# Patient Record
Sex: Female | Born: 1990 | Race: White | Hispanic: No | Marital: Married | Smoking: Never smoker
Health system: Southern US, Community
[De-identification: ages and names within clinical notes are randomized; demographics above are authoritative.]

## PROBLEM LIST (undated history)

## (undated) DIAGNOSIS — F32A Depression, unspecified: Secondary | ICD-10-CM

## (undated) DIAGNOSIS — R569 Unspecified convulsions: Secondary | ICD-10-CM

## (undated) DIAGNOSIS — F329 Major depressive disorder, single episode, unspecified: Secondary | ICD-10-CM

## (undated) DIAGNOSIS — F419 Anxiety disorder, unspecified: Secondary | ICD-10-CM

## (undated) HISTORY — DX: Anxiety disorder, unspecified: F41.9

## (undated) HISTORY — DX: Unspecified convulsions: R56.9

## (undated) HISTORY — DX: Depression, unspecified: F32.A

## (undated) HISTORY — PX: NO PAST SURGERIES: SHX2092

---
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
## 2016-10-23 ENCOUNTER — Encounter: Payer: Self-pay | Admitting: Obstetrics and Gynecology

## 2018-04-29 NOTE — L&D Delivery Note (Signed)
Patient was C/C/0 and pushed for approx 20 minutes with epidural.    NSVD female infant, Apgars 8/9, weight 8#15.   The patient had 1st degree laceration repaired with 3-0 vicryl. Fundus was firm. EBL was expected amount. Placenta was delivered intact. Vagina was clear.  Delayed cord clamping done for 30-60 seconds while warming baby. Baby was vigorous and doing skin to skin with mother.  Karen Valencia

## 2018-07-21 LAB — OB RESULTS CONSOLE ABO/RH: RH Type: POSITIVE

## 2018-07-21 LAB — OB RESULTS CONSOLE GC/CHLAMYDIA
Chlamydia: NEGATIVE
Gonorrhea: NEGATIVE

## 2018-07-21 LAB — OB RESULTS CONSOLE ANTIBODY SCREEN: Antibody Screen: NEGATIVE

## 2018-07-21 LAB — OB RESULTS CONSOLE RPR: RPR: NONREACTIVE

## 2018-07-21 LAB — OB RESULTS CONSOLE HEPATITIS B SURFACE ANTIGEN: Hepatitis B Surface Ag: NEGATIVE

## 2018-07-21 LAB — OB RESULTS CONSOLE HIV ANTIBODY (ROUTINE TESTING): HIV: NONREACTIVE

## 2018-07-21 LAB — OB RESULTS CONSOLE RUBELLA ANTIBODY, IGM: Rubella: IMMUNE

## 2019-01-15 LAB — OB RESULTS CONSOLE GBS: GBS: NEGATIVE

## 2019-02-05 ENCOUNTER — Other Ambulatory Visit: Payer: Self-pay | Admitting: Obstetrics & Gynecology

## 2019-02-05 ENCOUNTER — Telehealth (HOSPITAL_COMMUNITY): Payer: Self-pay | Admitting: *Deleted

## 2019-02-05 ENCOUNTER — Encounter (HOSPITAL_COMMUNITY): Payer: Self-pay | Admitting: *Deleted

## 2019-02-05 NOTE — Telephone Encounter (Signed)
Preadmission screen  

## 2019-02-08 ENCOUNTER — Telehealth (HOSPITAL_COMMUNITY): Payer: Self-pay | Admitting: *Deleted

## 2019-02-08 NOTE — Telephone Encounter (Signed)
Preadmission screen  

## 2019-02-09 ENCOUNTER — Encounter (HOSPITAL_COMMUNITY): Payer: Self-pay | Admitting: *Deleted

## 2019-02-09 ENCOUNTER — Telehealth (HOSPITAL_COMMUNITY): Payer: Self-pay | Admitting: *Deleted

## 2019-02-09 NOTE — Telephone Encounter (Signed)
Preadmission screen  

## 2019-02-11 ENCOUNTER — Other Ambulatory Visit: Payer: Self-pay

## 2019-02-11 ENCOUNTER — Encounter (HOSPITAL_COMMUNITY): Payer: Self-pay | Admitting: *Deleted

## 2019-02-11 ENCOUNTER — Inpatient Hospital Stay (HOSPITAL_COMMUNITY)
Admission: AD | Admit: 2019-02-11 | Discharge: 2019-02-12 | DRG: 807 | Disposition: A | Discharge status: Home / Self Care | Payer: BC Managed Care – PPO | Attending: Obstetrics and Gynecology | Admitting: Obstetrics and Gynecology

## 2019-02-11 ENCOUNTER — Inpatient Hospital Stay (HOSPITAL_COMMUNITY): Payer: BC Managed Care – PPO | Admitting: Anesthesiology

## 2019-02-11 DIAGNOSIS — Z20828 Contact with and (suspected) exposure to other viral communicable diseases: Secondary | ICD-10-CM | POA: Diagnosis present

## 2019-02-11 DIAGNOSIS — O26893 Other specified pregnancy related conditions, third trimester: Secondary | ICD-10-CM | POA: Diagnosis present

## 2019-02-11 DIAGNOSIS — Z3A40 40 weeks gestation of pregnancy: Secondary | ICD-10-CM

## 2019-02-11 DIAGNOSIS — E669 Obesity, unspecified: Secondary | ICD-10-CM

## 2019-02-11 DIAGNOSIS — O99214 Obesity complicating childbirth: Principal | ICD-10-CM | POA: Diagnosis present

## 2019-02-11 LAB — CBC
HCT: 40.5 % (ref 36.0–46.0)
Hemoglobin: 13.6 g/dL (ref 12.0–15.0)
MCH: 30.4 pg (ref 26.0–34.0)
MCHC: 33.6 g/dL (ref 30.0–36.0)
MCV: 90.6 fL (ref 80.0–100.0)
Platelets: 148 10*3/uL — ABNORMAL LOW (ref 150–400)
RBC: 4.47 MIL/uL (ref 3.87–5.11)
RDW: 14.3 % (ref 11.5–15.5)
WBC: 14.7 10*3/uL — ABNORMAL HIGH (ref 4.0–10.5)
nRBC: 0 % (ref 0.0–0.2)

## 2019-02-11 LAB — ABO/RH: ABO/RH(D): O POS

## 2019-02-11 LAB — TYPE AND SCREEN
ABO/RH(D): O POS
Antibody Screen: NEGATIVE

## 2019-02-11 LAB — SARS CORONAVIRUS 2 (TAT 6-24 HRS): SARS Coronavirus 2: NEGATIVE

## 2019-02-11 MED ORDER — ONDANSETRON HCL 4 MG/2ML IJ SOLN: 4.0000 mg | Freq: Four times a day (QID) | INTRAMUSCULAR | Status: DC | PRN

## 2019-02-11 MED ORDER — SOD CITRATE-CITRIC ACID 500-334 MG/5ML PO SOLN
30.0000 mL | ORAL | Status: DC | PRN
  Administered 2019-02-11: 30 mL via ORAL
  Filled 2019-02-11: qty 30

## 2019-02-11 MED ORDER — TETANUS-DIPHTH-ACELL PERTUSSIS 5-2.5-18.5 LF-MCG/0.5 IM SUSP: 0.5000 mL | Freq: Once | INTRAMUSCULAR | Status: DC

## 2019-02-11 MED ORDER — BENZOCAINE-MENTHOL 20-0.5 % EX AERO
1.0000 "application " | INHALATION_SPRAY | CUTANEOUS | Status: DC | PRN
  Filled 2019-02-11: qty 56

## 2019-02-11 MED ORDER — FLEET ENEMA 7-19 GM/118ML RE ENEM: 1.0000 | ENEMA | Freq: Once | RECTAL | Status: DC

## 2019-02-11 MED ORDER — PHENYLEPHRINE 40 MCG/ML (10ML) SYRINGE FOR IV PUSH (FOR BLOOD PRESSURE SUPPORT): 80.0000 ug | PREFILLED_SYRINGE | INTRAVENOUS | Status: DC | PRN

## 2019-02-11 MED ORDER — LACTATED RINGERS IV SOLN: 500.0000 mL | INTRAVENOUS | Status: DC | PRN

## 2019-02-11 MED ORDER — PHENYLEPHRINE 40 MCG/ML (10ML) SYRINGE FOR IV PUSH (FOR BLOOD PRESSURE SUPPORT)
80.0000 ug | PREFILLED_SYRINGE | INTRAVENOUS | Status: DC | PRN
  Filled 2019-02-11: qty 10

## 2019-02-11 MED ORDER — OXYTOCIN BOLUS FROM INFUSION
500.0000 mL | Freq: Once | INTRAVENOUS | Status: DC
  Administered 2019-02-11: 500 mL via INTRAVENOUS

## 2019-02-11 MED ORDER — OXYTOCIN 40 UNITS IN NORMAL SALINE INFUSION - SIMPLE MED
2.5000 [IU]/h | INTRAVENOUS | Status: DC
  Filled 2019-02-11: qty 1000

## 2019-02-11 MED ORDER — ONDANSETRON HCL 4 MG/2ML IJ SOLN: 4.0000 mg | INTRAMUSCULAR | Status: DC | PRN

## 2019-02-11 MED ORDER — DIBUCAINE (PERIANAL) 1 % EX OINT: 1.0000 "application " | TOPICAL_OINTMENT | CUTANEOUS | Status: DC | PRN

## 2019-02-11 MED ORDER — LIDOCAINE HCL (PF) 1 % IJ SOLN: 30.0000 mL | INTRAMUSCULAR | Status: DC | PRN

## 2019-02-11 MED ORDER — ZOLPIDEM TARTRATE 5 MG PO TABS: 5.0000 mg | ORAL_TABLET | Freq: Every evening | ORAL | Status: DC | PRN

## 2019-02-11 MED ORDER — EPHEDRINE 5 MG/ML INJ: 10.0000 mg | INTRAVENOUS | Status: DC | PRN

## 2019-02-11 MED ORDER — SENNOSIDES-DOCUSATE SODIUM 8.6-50 MG PO TABS
2.0000 | ORAL_TABLET | ORAL | Status: DC
  Administered 2019-02-12: 2 via ORAL
  Filled 2019-02-11: qty 2

## 2019-02-11 MED ORDER — SIMETHICONE 80 MG PO CHEW: 80.0000 mg | CHEWABLE_TABLET | ORAL | Status: DC | PRN

## 2019-02-11 MED ORDER — LIDOCAINE HCL (PF) 1 % IJ SOLN
INTRAMUSCULAR | Status: DC | PRN
  Administered 2019-02-11: 10 mL via EPIDURAL

## 2019-02-11 MED ORDER — OXYCODONE-ACETAMINOPHEN 5-325 MG PO TABS: 1.0000 | ORAL_TABLET | ORAL | Status: DC | PRN

## 2019-02-11 MED ORDER — LACTATED RINGERS IV SOLN
INTRAVENOUS | Status: DC
  Administered 2019-02-11: 14:00:00 via INTRAVENOUS

## 2019-02-11 MED ORDER — OXYCODONE-ACETAMINOPHEN 5-325 MG PO TABS: 2.0000 | ORAL_TABLET | ORAL | Status: DC | PRN

## 2019-02-11 MED ORDER — FENTANYL-BUPIVACAINE-NACL 0.5-0.125-0.9 MG/250ML-% EP SOLN
12.0000 mL/h | EPIDURAL | Status: DC | PRN
  Filled 2019-02-11: qty 250

## 2019-02-11 MED ORDER — WITCH HAZEL-GLYCERIN EX PADS: 1.0000 "application " | MEDICATED_PAD | CUTANEOUS | Status: DC | PRN

## 2019-02-11 MED ORDER — IBUPROFEN 600 MG PO TABS
600.0000 mg | ORAL_TABLET | Freq: Four times a day (QID) | ORAL | Status: DC
  Administered 2019-02-12 (×4): 600 mg via ORAL
  Filled 2019-02-11 (×4): qty 1

## 2019-02-11 MED ORDER — SODIUM CHLORIDE (PF) 0.9 % IJ SOLN
INTRAMUSCULAR | Status: DC | PRN
  Administered 2019-02-11: 12 mL/h via EPIDURAL

## 2019-02-11 MED ORDER — OXYCODONE-ACETAMINOPHEN 5-325 MG PO TABS
2.0000 | ORAL_TABLET | ORAL | Status: DC | PRN
  Administered 2019-02-12 (×2): 2 via ORAL
  Filled 2019-02-11 (×2): qty 2

## 2019-02-11 MED ORDER — LACTATED RINGERS IV SOLN: 500.0000 mL | Freq: Once | INTRAVENOUS | Status: DC

## 2019-02-11 MED ORDER — COCONUT OIL OIL: 1.0000 "application " | TOPICAL_OIL | Status: DC | PRN

## 2019-02-11 MED ORDER — ACETAMINOPHEN 325 MG PO TABS: 650.0000 mg | ORAL_TABLET | ORAL | Status: DC | PRN

## 2019-02-11 MED ORDER — PRENATAL MULTIVITAMIN CH
1.0000 | ORAL_TABLET | Freq: Every day | ORAL | Status: DC
  Administered 2019-02-12: 1 via ORAL
  Filled 2019-02-11: qty 1

## 2019-02-11 MED ORDER — DIPHENHYDRAMINE HCL 25 MG PO CAPS: 25.0000 mg | ORAL_CAPSULE | Freq: Four times a day (QID) | ORAL | Status: DC | PRN

## 2019-02-11 MED ORDER — DIPHENHYDRAMINE HCL 50 MG/ML IJ SOLN: 12.5000 mg | INTRAMUSCULAR | Status: DC | PRN

## 2019-02-11 MED ORDER — ONDANSETRON HCL 4 MG PO TABS: 4.0000 mg | ORAL_TABLET | ORAL | Status: DC | PRN

## 2019-02-11 MED ORDER — ACETAMINOPHEN 325 MG PO TABS
650.0000 mg | ORAL_TABLET | ORAL | Status: DC | PRN
  Administered 2019-02-11: 650 mg via ORAL
  Filled 2019-02-11: qty 2

## 2019-02-11 NOTE — Anesthesia Preprocedure Evaluation (Signed)
Anesthesia Evaluation  Patient identified by MRN, date of birth, ID band Patient awake    Reviewed: Allergy & Precautions, H&P , NPO status , Patient's Chart, lab work & pertinent test results  History of Anesthesia Complications Negative for: history of anesthetic complications  Airway Mallampati: II  TM Distance: >3 FB Neck ROM: full    Dental no notable dental hx.    Pulmonary neg pulmonary ROS,    Pulmonary exam normal        Cardiovascular negative cardio ROS Normal cardiovascular exam Rhythm:regular Rate:Normal     Neuro/Psych PSYCHIATRIC DISORDERS Anxiety Depression negative neurological ROS     GI/Hepatic negative GI ROS, Neg liver ROS,   Endo/Other  Morbid obesity  Renal/GU negative Renal ROS  negative genitourinary   Musculoskeletal   Abdominal   Peds  Hematology negative hematology ROS (+)   Anesthesia Other Findings   Reproductive/Obstetrics (+) Pregnancy                             Anesthesia Physical Anesthesia Plan  ASA: III  Anesthesia Plan: Epidural   Post-op Pain Management:    Induction:   PONV Risk Score and Plan:   Airway Management Planned:   Additional Equipment:   Intra-op Plan:   Post-operative Plan:   Informed Consent: I have reviewed the patients History and Physical, chart, labs and discussed the procedure including the risks, benefits and alternatives for the proposed anesthesia with the patient or authorized representative who has indicated his/her understanding and acceptance.       Plan Discussed with:   Anesthesia Plan Comments:         Anesthesia Quick Evaluation

## 2019-02-11 NOTE — MAU Note (Signed)
Pt reports ctxs since 250am. Now closer and stronger. Reports mucus plug came out but no bleeding or leaking of fluid at this time. Good fetal movement felt.

## 2019-02-11 NOTE — Anesthesia Procedure Notes (Signed)
Epidural Patient location during procedure: OB Start time: 02/11/2019 2:30 PM End time: 02/11/2019 2:42 PM  Staffing Anesthesiologist: Lidia Collum, MD Performed: anesthesiologist   Preanesthetic Checklist Completed: patient identified, pre-op evaluation, timeout performed, IV checked, risks and benefits discussed and monitors and equipment checked  Epidural Patient position: sitting Prep: DuraPrep Patient monitoring: heart rate, continuous pulse ox and blood pressure Approach: midline Location: L3-L4 Injection technique: LOR air  Needle:  Needle type: Tuohy  Needle gauge: 17 G Needle length: 9 cm Needle insertion depth: 6 cm Catheter type: closed end flexible Catheter size: 19 Gauge Catheter at skin depth: 11 cm Test dose: negative  Assessment Events: blood not aspirated, injection not painful, no injection resistance, negative IV test and no paresthesia  Additional Notes Reason for block:procedure for pain

## 2019-02-12 DIAGNOSIS — E669 Obesity, unspecified: Secondary | ICD-10-CM

## 2019-02-12 LAB — CBC
HCT: 34.6 % — ABNORMAL LOW (ref 36.0–46.0)
Hemoglobin: 11.7 g/dL — ABNORMAL LOW (ref 12.0–15.0)
MCH: 30.4 pg (ref 26.0–34.0)
MCHC: 33.8 g/dL (ref 30.0–36.0)
MCV: 89.9 fL (ref 80.0–100.0)
Platelets: 142 10*3/uL — ABNORMAL LOW (ref 150–400)
RBC: 3.85 MIL/uL — ABNORMAL LOW (ref 3.87–5.11)
RDW: 14.5 % (ref 11.5–15.5)
WBC: 17.5 10*3/uL — ABNORMAL HIGH (ref 4.0–10.5)
nRBC: 0 % (ref 0.0–0.2)

## 2019-02-12 LAB — RPR: RPR Ser Ql: NONREACTIVE

## 2019-02-12 MED ORDER — OXYCODONE-ACETAMINOPHEN 5-325 MG PO TABS: 1.0000 | ORAL_TABLET | ORAL | 0 refills | Status: DC | PRN

## 2019-02-12 MED ORDER — IBUPROFEN 600 MG PO TABS: 600.0000 mg | ORAL_TABLET | Freq: Four times a day (QID) | ORAL | 1 refills | Status: DC

## 2019-02-12 MED ORDER — SENNOSIDES-DOCUSATE SODIUM 8.6-50 MG PO TABS: 2.0000 | ORAL_TABLET | ORAL | 0 refills | Status: DC

## 2019-02-12 NOTE — Anesthesia Postprocedure Evaluation (Signed)
Anesthesia Post Note  Patient: Karen Valencia  Procedure(s) Performed: AN AD HOC LABOR EPIDURAL     Patient location during evaluation: Mother Baby Anesthesia Type: Epidural Level of consciousness: awake and alert, oriented and patient cooperative Pain management: pain level controlled Vital Signs Assessment: post-procedure vital signs reviewed and stable Respiratory status: spontaneous breathing Cardiovascular status: stable Postop Assessment: no headache, epidural receding, patient able to bend at knees and no signs of nausea or vomiting Anesthetic complications: no Comments: Pt. Interviewed via phone consultation.  Pt. States she is walking.  Pain score 3.      Last Vitals:  Vitals:   02/12/19 0000 02/12/19 0517  BP: 115/84 107/62  Pulse: (!) 101 96  Resp:  17  Temp: 36.8 C 36.9 C  SpO2: 99% 98%    Last Pain:  Vitals:   02/12/19 0521  TempSrc:   PainSc: 2    Pain Goal:                   [facility]

## 2019-02-12 NOTE — Progress Notes (Signed)
Post Partum Day 1 Subjective: no complaints, up ad lib, voiding and tolerating PO  Objective: Vitals:   02/11/19 1814 02/11/19 1925 02/12/19 0000 02/12/19 0517  BP: 125/73 (!) 123/94 115/84 107/62  Pulse: (!) 108 (!) 117 (!) 101 96  Resp: 18 19  17   Temp: 99.3 F (37.4 C) 99.1 F (37.3 C) 98.3 F (36.8 C) 98.4 F (36.9 C)  TempSrc: Oral Oral Oral Oral  SpO2: 99% 98% 99% 98%  Weight:      Height:        Physical Exam:  General: alert and no distress Lochia: appropriate Uterine Fundus: firm DVT Evaluation: No evidence of DVT seen on physical exam.  Recent Labs    02/11/19 1335 02/12/19 0538  HGB 13.6 11.7*  HCT 40.5 34.6*    Assessment/Plan: Karen Valencia 28 y.o. G3P2011 PPD#1, sp SVD 10/15.  Doing well, appropriate for discharge this afternoon Planning circ with peds outpatient Considering BTL vs. Vasectomy Formula feeing Rubella immune, O+  LOS: 1 day   Brooks Kinnan K Taam-Akelman 02/12/2019, 9:31 AM

## 2019-02-12 NOTE — Progress Notes (Signed)
CSW received consult for history of anxiety and depression.  CSW met with MOB to offer support and complete assessment.    MOB resting in bed holding infant with FOB present at bedside, when CSW entered the room. CSW introduced self and received verbal permission from MOB to complete assessment with FOB present. CSW introduced self and explained reason for consult to which MOB expressed understanding. MOB and FOB both pleasant and engaged throughout assessment and were observed to be well-bonded to infant. CSW inquired about MOB's mental health history and MOB acknowledged having anxiety and depression back in 2015 but attributed much of it to her PCOS and Nexplanon. MOB reported feeling overwhelmed every once in a while but stated symptoms were manageable. MOB did acknowledge feeling some depression following the birth of her first child but feels it had to do with her weight. CSW inquired about how MOB was feeling now and MOB reported she feels "good and complete".  CSW provided education regarding the baby blues period vs. perinatal mood disorders, discussed treatment and gave resources for mental health follow up if concerns arise. CSW recommended self-evaluation during the postpartum time period using the New Mom Checklist from Postpartum Progress and encouraged MOB to contact a medical professional if symptoms are noted at any time. MOB did not appear to be displaying any acute mental health symptoms and denied any current SI or HI. MOB reported feeling well supported by FOB, her mother-in-law and her family in [place].   MOB confirmed having all essential items for infant once discharged and reported infant would be sleeping in a crib once home. CSW provided review of Sudden Infant Death Syndrome (SIDS) precautions and safe sleeping habits.    CSW identifies no further need for intervention and no barriers to discharge at this time.  Elijio Miles, [place]  [facility] [employer] [phone]

## 2019-02-12 NOTE — Discharge Summary (Signed)
OB Discharge Summary     Patient Name: Karen Valencia DOB: 04-25-1991 MRN: [id number]  Date of admission: 02/11/2019 Delivering MD: Allyn Kenner   Date of discharge: 02/12/2019  Admitting diagnosis: CTX 2 to 58mins Intrauterine pregnancy: [redacted]w[redacted]d     Secondary diagnosis:  Active Problems:   Normal labor   Obesity  Additional problems: none     Discharge diagnosis: Term Pregnancy Delivered                                                                                                Post partum procedures:none  Augmentation: AROM  Complications: None  Hospital course:  Onset of Labor With Vaginal Delivery     28 y.o. yo G3P2011 at [redacted]w[redacted]d was admitted in Latent Labor on 02/11/2019. Patient had an uncomplicated labor course as follows: on admit 3.5/70/-2, contracting spontaneously, 4 hours later progressed to 6/90, AROM'ed and then 1 hour later was C/C/0 and pushed for approx 20 minutes with epidural.    Membrane Rupture Time/Date: 3:41 PM ,02/11/2019   Intrapartum Procedures: Episiotomy: None [1]                                         Lacerations:  1st degree [2]  Patient had a delivery of a Viable infant. 02/11/2019  Information for the patient's newborn:  Lanetta, Figuero [[id number]]  Delivery Method: Vag-Spont     Pateint had an uncomplicated postpartum course.  She is ambulating, tolerating a regular diet, passing flatus, and urinating well. Patient is discharged home in stable condition on 02/12/19.   Physical exam  Vitals:   02/11/19 1814 02/11/19 1925 02/12/19 0000 02/12/19 0517  BP: 125/73 (!) 123/94 115/84 107/62  Pulse: (!) 108 (!) 117 (!) 101 96  Resp: 18 19  17   Temp: 99.3 F (37.4 C) 99.1 F (37.3 C) 98.3 F (36.8 C) 98.4 F (36.9 C)  TempSrc: Oral Oral Oral Oral  SpO2: 99% 98% 99% 98%  Weight:      Height:       General: alert Lochia: appropriate Uterine Fundus: firm DVT Evaluation: No evidence of DVT seen on physical exam. Labs: Lab  Results  Component Value Date   WBC 17.5 (H) 02/12/2019   HGB 11.7 (L) 02/12/2019   HCT 34.6 (L) 02/12/2019   MCV 89.9 02/12/2019   PLT 142 (L) 02/12/2019   No flowsheet data found.  Discharge instruction: per After Visit Summary and "Baby and Me Booklet".  After visit meds:  Allergies as of 02/12/2019   No Known Allergies     Medication List    TAKE these medications   acetaminophen 325 MG tablet Commonly known as: TYLENOL Take 325-650 mg by mouth every 6 (six) hours as needed for mild pain.   calcium carbonate 500 MG chewable tablet Commonly known as: TUMS - dosed in mg elemental calcium Chew 1-2 tablets by mouth as needed for indigestion or heartburn.   clindamycin 1 % gel Commonly known as: CLINDAGEL Apply 1  application topically See admin instructions. APPLY THIN COAT TO AFFECTED AREA TWICE A DAY   ibuprofen 600 MG tablet Commonly known as: ADVIL Take 1 tablet (600 mg total) by mouth every 6 (six) hours.   oxyCODONE-acetaminophen 5-325 MG tablet Commonly known as: PERCOCET/ROXICET Take 1 tablet by mouth every 4 (four) hours as needed (pain scale 4-7).   senna-docusate 8.6-50 MG tablet Commonly known as: Senokot-S Take 2 tablets by mouth daily. Start taking on: February 13, 2019       Diet: routine diet  Activity: Advance as tolerated. Pelvic rest for 6 weeks.   Outpatient follow up:4 weeks Follow up Appt: none Follow up Visit:No follow-ups on file.  Postpartum contraception: BTL vs. basectomy  Newborn Data: Live born female  Birth Weight: 8 lb 15 oz (4054 g) APGAR: 8, 9  Newborn Delivery   Birth date/time: 02/11/2019 16:26:00 Delivery type: Vaginal, Spontaneous      Baby Feeding: Bottle Disposition:home with mother   02/12/2019 Rande Brunt, MD

## 2019-02-13 ENCOUNTER — Other Ambulatory Visit (HOSPITAL_COMMUNITY): Admission: RE | Admit: 2019-02-13 | Payer: BC Managed Care – PPO | Source: Ambulatory Visit

## 2019-02-15 ENCOUNTER — Inpatient Hospital Stay (HOSPITAL_COMMUNITY): Payer: BC Managed Care – PPO

## 2019-02-15 ENCOUNTER — Inpatient Hospital Stay (HOSPITAL_COMMUNITY)
Admission: AD | Admit: 2019-02-15 | Payer: BC Managed Care – PPO | Source: Home / Self Care | Admitting: Obstetrics & Gynecology

## 2019-02-15 NOTE — H&P (Signed)
28 y.o. [redacted]w[redacted]d  G3P2011 comes in c/o contractions.  Otherwise has good fetal movement and no bleeding.  Past Medical History:  Diagnosis Date  . Anxiety   . Depression   . Seizures (HCC)    childhood    Past Surgical History:  Procedure Laterality Date  . NO PAST SURGERIES      OB History  Gravida Para Term Preterm AB Living  3 2 2   1 1   SAB TAB Ectopic Multiple Live Births  1     0 1    # Outcome Date GA Lbr Len/2nd Weight Sex Delivery Anes PTL Lv  3 Term 02/11/19 [redacted]w[redacted]d 06:04 / 00:22 4054 g M Vag-Spont EPI  LIV  2 Term 2017     Vag-Spont     1 SAB             Social History   Socioeconomic History  . Marital status: Married    Spouse name: Not on file  . Number of children: Not on file  . Years of education: Not on file  . Highest education level: Not on file  Occupational History  . Not on file  Social Needs  . Financial resource strain: Not on file  . Food insecurity    Worry: Not on file    Inability: Not on file  . Transportation needs    Medical: Not on file    Non-medical: Not on file  Tobacco Use  . Smoking status: Never Smoker  . Smokeless tobacco: Never Used  Substance and Sexual Activity  . Alcohol use: Not Currently  . Drug use: Never  . Sexual activity: Yes    Birth control/protection: None  Lifestyle  . Physical activity    Days per week: Not on file    Minutes per session: Not on file  . Stress: Not on file  Relationships  . Social 2018 on phone: Not on file    Gets together: Not on file    Attends religious service: Not on file    Active member of club or organization: Not on file    Attends meetings of clubs or organizations: Not on file    Relationship status: Not on file  . Intimate partner violence    Fear of current or ex partner: Not on file    Emotionally abused: Not on file    Physically abused: Not on file    Forced sexual activity: Not on file  Other Topics Concern  . Not on file  Social History Narrative   . Not on file   Patient has no known allergies.    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 Toco:  q 2-5   A/P   Admitted for labor  GBS Neg  EFW 6.5# #35 weeks Current EFW 8.5# estimated by [facility] Epidural when desired Other routine care Allyn Kenner

## 2020-05-09 ENCOUNTER — Other Ambulatory Visit: Payer: Self-pay

## 2020-05-09 ENCOUNTER — Encounter: Payer: Self-pay | Admitting: Emergency Medicine

## 2020-05-09 ENCOUNTER — Ambulatory Visit
Admission: EM | Admit: 2020-05-09 | Discharge: 2020-05-09 | Disposition: A | Discharge status: Home / Self Care | Payer: Self-pay | Attending: Family Medicine | Admitting: Family Medicine

## 2020-05-09 DIAGNOSIS — L02416 Cutaneous abscess of left lower limb: Secondary | ICD-10-CM

## 2020-05-09 MED ORDER — DOXYCYCLINE HYCLATE 100 MG PO TABS: 100.0000 mg | ORAL_TABLET | Freq: Two times a day (BID) | ORAL | 0 refills | Status: AC

## 2020-05-09 NOTE — Discharge Instructions (Addendum)
Change the dressing every 24 hours for the next two days.  Clean the area with soap and water.  We would like you to come back in two days for re-evaluation. We have prescribed doxycycline for you to take twice a day.  If you have any signs of systemic infection such as fever or vomiting please come back to get re-evaluated.

## 2020-05-09 NOTE — ED Provider Notes (Signed)
[facility]    CSN: [id number] Arrival date & time: 05/09/20  1355      History   Chief Complaint Chief Complaint  Patient presents with  . Abscess    HPI Karen Valencia is a 30 y.o. female.   3 days of pain on inner thigh.  Pt has a history of abscesses.  She usually gets them on her abdomen and axilla.  She is not experiencing any drainage currently.  Usually they go away on their own but this one is especially painful and would like it drained. No fevers or vomiting.      Past Medical History:  Diagnosis Date  . Anxiety   . Depression   . Seizures (HCC)    childhood    Patient Active Problem List   Diagnosis Date Noted  . Obesity 02/12/2019  . Normal labor 02/11/2019    Past Surgical History:  Procedure Laterality Date  . NO PAST SURGERIES      OB History    Gravida  3   Para  2   Term  2   Preterm      AB  1   Living  1     SAB  1   IAB      Ectopic      Multiple  0   Live Births  1            Home Medications    Prior to Admission medications   Medication Sig Start Date End Date Taking? Authorizing Provider  doxycycline (VIBRA-TABS) 100 MG tablet Take 1 tablet (100 mg total) by mouth 2 (two) times daily for 5 days. 05/09/20 05/14/20 Yes Sandre Kitty, MD  acetaminophen (TYLENOL) 325 MG tablet Take 325-650 mg by mouth every 6 (six) hours as needed for mild pain.    [provider]  calcium carbonate (TUMS - DOSED IN MG ELEMENTAL CALCIUM) 500 MG chewable tablet Chew 1-2 tablets by mouth as needed for indigestion or heartburn.    [provider]  clindamycin (CLINDAGEL) 1 % gel Apply 1 application topically See admin instructions. APPLY THIN COAT TO AFFECTED AREA TWICE A DAY Patient not taking: Reported on 05/09/2020 11/17/18   [provider]  ibuprofen (ADVIL) 600 MG tablet Take 1 tablet (600 mg total) by mouth every 6 (six) hours. 02/12/19   Taam-Akelman, Griselda Miner, MD  oxyCODONE-acetaminophen  (PERCOCET/ROXICET) 5-325 MG tablet Take 1 tablet by mouth every 4 (four) hours as needed (pain scale 4-7). Patient not taking: Reported on 05/09/2020 02/12/19   Rande Brunt, MD  senna-docusate (SENOKOT-S) 8.6-50 MG tablet Take 2 tablets by mouth daily. 02/13/19   Rande Brunt, MD    Family History History reviewed. No pertinent family history.  Social History Social History   Tobacco Use  . Smoking status: Never Smoker  . Smokeless tobacco: Never Used  Vaping Use  . Vaping Use: Never used  Substance Use Topics  . Alcohol use: Not Currently  . Drug use: Never     Allergies   Patient has no known allergies.   Review of Systems Review of Systems  All other systems reviewed and are negative.    Physical Exam Triage Vital Signs ED Triage Vitals [05/09/20 1458]  Enc Vitals Group     BP (!) 144/90     Pulse Rate (!) 110     Resp 18     Temp 98.1 F (36.7 C)     Temp Source Oral  SpO2 98 %     Weight      Height      Head Circumference      Peak Flow      Pain Score 8     Pain Loc      Pain Edu?      Excl. in GC?    No data found.  Updated Vital Signs BP (!) 144/90 (BP Location: Right Arm)   Pulse (!) 110   Temp 98.1 F (36.7 C) (Oral)   Resp 18   SpO2 98%   Visual Acuity Right Eye Distance:   Left Eye Distance:   Bilateral Distance:    Right Eye Near:   Left Eye Near:    Bilateral Near:     Physical Exam Constitutional:      Appearance: She is obese.  Skin:    Findings: Abscess present.       Neurological:     Mental Status: She is alert.      UC Treatments / Results  Labs (all labs ordered are listed, but only abnormal results are displayed) Labs Reviewed - No data to display  EKG   Radiology No results found.  Procedures Incision and Drainage  Date/Time: 05/09/2020 3:51 PM Performed by: Sandre Kitty, MD Authorized by: Merrilee Jansky, MD   Consent:    Consent obtained:  Verbal   Consent  given by:  Patient   Risks, benefits, and alternatives were discussed: yes     Risks discussed:  Bleeding, incomplete drainage and infection   Alternatives discussed:  No treatment Universal protocol:    Procedure explained and questions answered to patient or proxy's satisfaction: yes     Patient identity confirmed:  Verbally with patient Location:    Type:  Abscess   Size:  3cm   Location:  Lower extremity   Lower extremity location:  Leg   Leg location:  L upper leg Pre-procedure details:    Skin preparation:  Antiseptic wash and povidone-iodine Sedation:    Sedation type:  None Anesthesia:    Anesthesia method:  Local infiltration   Local anesthetic:  Lidocaine 2% w/o epi Procedure type:    Complexity:  Simple Procedure details:    Ultrasound guidance: no     Needle aspiration: no     Incision types:  Cruciate   Incision depth:  Dermal   Wound management:  Probed and deloculated   Drainage:  Bloody and purulent   Drainage amount:  Moderate   Wound treatment:  Wound left open   Packing materials:  None Post-procedure details:    Procedure completion:  Tolerated well, no immediate complications   (including critical care time)  Medications Ordered in UC Medications - No data to display  Initial Impression / Assessment and Plan / UC Course  I have reviewed the triage vital signs and the nursing notes.  Pertinent labs & imaging results that were available during my care of the patient were reviewed by me and considered in my medical decision making (see chart for details).     Patient with skin abscess on medial left thigh for past three days. Incision and drainage performed.  Wound dressing was applied.  Doxycycline x  5days was given. Patient advised to return in two days for re-evaluation.    Final Clinical Impressions(s) / UC Diagnoses   Final diagnoses:  Cutaneous abscess of left lower extremity     Discharge Instructions     Change the dressing every 24  hours for the next two days.  Clean the area with soap and water.  We would like you to come back in two days for re-evaluation. We have prescribed doxycycline for you to take twice a day.  If you have any signs of systemic infection such as fever or vomiting please come back to get re-evaluated.     ED Prescriptions    Medication Sig Dispense Auth. Provider   doxycycline (VIBRA-TABS) 100 MG tablet Take 1 tablet (100 mg total) by mouth 2 (two) times daily for 5 days. 10 tablet Sandre Kitty, MD     PDMP not reviewed this encounter.   Sandre Kitty, MD 05/09/20 [id number]

## 2020-05-09 NOTE — ED Triage Notes (Signed)
Pt here for left upper thigh abscess x 3 days with pain and swelling; pt sts hx of similar in past

## 2020-11-14 LAB — HEPATITIS C ANTIBODY: HCV Ab: NEGATIVE

## 2020-11-14 LAB — OB RESULTS CONSOLE HIV ANTIBODY (ROUTINE TESTING): HIV: NONREACTIVE

## 2020-11-14 LAB — OB RESULTS CONSOLE RPR: RPR: NONREACTIVE

## 2020-11-14 LAB — OB RESULTS CONSOLE HEPATITIS B SURFACE ANTIGEN: Hepatitis B Surface Ag: NEGATIVE

## 2020-11-14 LAB — OB RESULTS CONSOLE RUBELLA ANTIBODY, IGM: Rubella: IMMUNE

## 2020-11-14 LAB — OB RESULTS CONSOLE GC/CHLAMYDIA
Chlamydia: NEGATIVE
Gonorrhea: NEGATIVE

## 2020-11-14 LAB — OB RESULTS CONSOLE VARICELLA ZOSTER ANTIBODY, IGG: Varicella: IMMUNE

## 2021-01-15 ENCOUNTER — Other Ambulatory Visit: Payer: Self-pay | Admitting: Obstetrics and Gynecology

## 2021-01-15 DIAGNOSIS — F418 Other specified anxiety disorders: Secondary | ICD-10-CM

## 2021-01-15 DIAGNOSIS — Z3A21 21 weeks gestation of pregnancy: Secondary | ICD-10-CM

## 2021-01-15 DIAGNOSIS — O283 Abnormal ultrasonic finding on antenatal screening of mother: Secondary | ICD-10-CM

## 2021-01-15 DIAGNOSIS — Z363 Encounter for antenatal screening for malformations: Secondary | ICD-10-CM

## 2021-01-17 ENCOUNTER — Other Ambulatory Visit: Payer: Self-pay

## 2021-01-18 ENCOUNTER — Ambulatory Visit: Payer: Medicaid Other

## 2021-01-18 ENCOUNTER — Ambulatory Visit: Payer: Medicaid Other | Attending: Obstetrics and Gynecology

## 2021-01-18 ENCOUNTER — Encounter: Payer: Self-pay | Admitting: *Deleted

## 2021-01-18 ENCOUNTER — Ambulatory Visit: Payer: Medicaid Other | Admitting: *Deleted

## 2021-01-18 ENCOUNTER — Other Ambulatory Visit: Payer: Self-pay

## 2021-01-18 ENCOUNTER — Ambulatory Visit (HOSPITAL_BASED_OUTPATIENT_CLINIC_OR_DEPARTMENT_OTHER): Payer: Medicaid Other | Admitting: Maternal & Fetal Medicine

## 2021-01-18 VITALS — BP 138/77 | HR 105

## 2021-01-18 DIAGNOSIS — Z363 Encounter for antenatal screening for malformations: Secondary | ICD-10-CM | POA: Insufficient documentation

## 2021-01-18 DIAGNOSIS — O283 Abnormal ultrasonic finding on antenatal screening of mother: Secondary | ICD-10-CM | POA: Diagnosis present

## 2021-01-18 DIAGNOSIS — O99342 Other mental disorders complicating pregnancy, second trimester: Secondary | ICD-10-CM | POA: Insufficient documentation

## 2021-01-18 DIAGNOSIS — Z3A21 21 weeks gestation of pregnancy: Secondary | ICD-10-CM

## 2021-01-18 DIAGNOSIS — O43102 Malformation of placenta, unspecified, second trimester: Secondary | ICD-10-CM | POA: Diagnosis not present

## 2021-01-18 DIAGNOSIS — O99212 Obesity complicating pregnancy, second trimester: Secondary | ICD-10-CM | POA: Insufficient documentation

## 2021-01-18 DIAGNOSIS — F418 Other specified anxiety disorders: Secondary | ICD-10-CM | POA: Insufficient documentation

## 2021-01-18 DIAGNOSIS — E669 Obesity, unspecified: Secondary | ICD-10-CM

## 2021-01-18 DIAGNOSIS — O020 Blighted ovum and nonhydatidiform mole: Secondary | ICD-10-CM

## 2021-01-18 NOTE — Progress Notes (Signed)
MFM Consultation  Karen Valencia is a 30 yo G4P1 who is here at 45 w 2 d for a consultation at the request of Dr. Pryor Ochoa.   She is dated by a LMP consistent with a 12 w 2 d ultrasound.   She is seen at the request of Dr. Mindi Slicker due to an abnormal ultrasound finding of a anterior wall cystic area with concerns for complete hydatiform mole.   [name] pregnancy related issues include:  1) Early subchorionic hemorrhage: 11/14/20. She reports significant bleeding heavier than a menses. She reports her last bleed was 4 weeks ago. She reports some cramping but they have resolved mostly. We do not have a prior ultrasound to evaluate the images or findings seen at that time. Her CBC was normal. Her prenatal notes suggest that the [facility] was 5.6 cm. We don't have a serum quantitative hcg at the time of this visit.  2) Elevate BMI > 40; She is taking low dose ASA daily. She is planning for serial growth exams during the pregnancy and initiating weekly testing at 36 weeks. She is aware of the goal of 11-20 lb weight gain throughout the pregnancy.   3) History of seizures as a child: She denies seizures for > 10 years. She is not on medical therapy.  4) Chronic migraines: [name] has had bilateral temporal headaches. She denies nausea and vomiting. She has not taken anything for her headaches.   Vitals with BMI 01/18/2021 05/09/2020 02/12/2019  Height - - -  Weight - - -  BMI - - -  Systolic 138 144 643  Diastolic 77 90 62  Pulse 105 110 96   OB History  Gravida Para Term Preterm AB Living  4 2 2  0 1 1  SAB IAB Ectopic Multiple Live Births  1 0 0 0 1    # Outcome Date GA Lbr Len/2nd Weight Sex Delivery Anes PTL Lv  4 Current           3 Term 02/11/19 [redacted]w[redacted]d 06:04 / 00:22 8 lb 15 oz (4.054 kg) M Vag-Spont EPI  LIV     Name: Karen Valencia     Apgar1: 8  Apgar5: 9  2 Term 2017     Vag-Spont     1 SAB            Past Medical History:  Diagnosis Date   Anxiety    Depression    Seizures  (HCC)    childhood   Past Surgical History:  Procedure Laterality Date   NO PAST SURGERIES     Social History   Socioeconomic History   Marital status: Married    Spouse name: Not on file   Number of children: Not on file   Years of education: Not on file   Highest education level: Not on file  Occupational History   Not on file  Tobacco Use   Smoking status: Never   Smokeless tobacco: Never  Vaping Use   Vaping Use: Never used  Substance and Sexual Activity   Alcohol use: Not Currently   Drug use: Never   Sexual activity: Yes    Birth control/protection: None  Other Topics Concern   Not on file  Social History Narrative   Not on file   Social Determinants of Health   Financial Resource Strain: Not on file  Food Insecurity: Not on file  Transportation Needs: Not on file  Physical Activity: Not on file  Stress: Not on file  Social Connections: Not on file  Intimate Partner Violence: Not on file       Current Outpatient Medications (Analgesics):    acetaminophen (TYLENOL) 325 MG tablet, Take 325-650 mg by mouth every 6 (six) hours as needed for mild pain.   ibuprofen (ADVIL) 600 MG tablet, Take 1 tablet (600 mg total) by mouth every 6 (six) hours.   oxyCODONE-acetaminophen (PERCOCET/ROXICET) 5-325 MG tablet, Take 1 tablet by mouth every 4 (four) hours as needed (pain scale 4-7). (Patient not taking: Reported on 05/09/2020)   Current Outpatient Medications (Other):    calcium carbonate (TUMS - DOSED IN MG ELEMENTAL CALCIUM) 500 MG chewable tablet, Chew 1-2 tablets by mouth as needed for indigestion or heartburn.   clindamycin (CLINDAGEL) 1 % gel, Apply 1 application topically See admin instructions. APPLY THIN COAT TO AFFECTED AREA TWICE A DAY (Patient not taking: Reported on 05/09/2020)   senna-docusate (SENOKOT-S) 8.6-50 MG tablet, Take 2 tablets by mouth daily.  No Known Allergies  Imaging: A single intrauterine pregnancy was observed. A detailed exam was  performed. Suboptimal views of the fetal anatomy was seen  due to fetal position.  The placenta was posterior and normal in appearance.  We observed an anterior elliptical shaped area that had a heterogenous cystic appearance. There was no color flow through the area and appears subamniotic. It was suspicious for a prior hemorrhage that was resolveing. The placental uterine interface appeared normal for a single intrauterine pregnancy with no evidence of possible twin membrane. There was no twin peak sign.  Impression/Counseling:  1) Elliptical subamniotic cystic area: I discussed today's findings and reviewed the imaging. I explained that the differential diagnosis includes subamniotic absorbing hemorrhage, vs complete co-twin hydatidiform mole. We discussed an evaluation of labs including hcg, CBC and CMP. A UPC could not be drawn from our lab here today, so please draw this lab at your convenience. I explained that a molar pregnancy will likely have an elevated hcg beyond this gestational age. In addition, we discussed that there is a distinct placenta with a normally growth fetus, making it less likely that it is a partial mole. Lastly, I suspect that this is most likely a resolving hemorrhage from a month ago.  We discussed that there is an increased risk for PPROM, PTB and placental abruption in cases where there is a hemorrhage.   We ordered a q-hcg, TSH, CBC and CMP today. We have scheduled [name] to return in 2 weeks for a limited exam and in 4 weeks for a growth exam.  2) BMI > 40 - I agree with the counseling and plan for TWG and serial growth. I reiterated this today. [name] expressed and understanding.   3) Regarding Seizures: Expectant management  4) Regarding chronic migraines: Consider referral to neurologist for evaluation.   I spent 60 minutes with > 50% in face to face consultation  [name], MD.

## 2021-01-19 LAB — COMPREHENSIVE METABOLIC PANEL
ALT: 6 IU/L (ref 0–32)
AST: 12 IU/L (ref 0–40)
Albumin/Globulin Ratio: 1.6 (ref 1.2–2.2)
Albumin: 3.8 g/dL — ABNORMAL LOW (ref 3.9–5.0)
Alkaline Phosphatase: 35 IU/L — ABNORMAL LOW (ref 44–121)
BUN/Creatinine Ratio: 13 (ref 9–23)
BUN: 6 mg/dL (ref 6–20)
Bilirubin Total: 0.2 mg/dL (ref 0.0–1.2)
CO2: 17 mmol/L — ABNORMAL LOW (ref 20–29)
Calcium: 9 mg/dL (ref 8.7–10.2)
Chloride: 102 mmol/L (ref 96–106)
Creatinine, Ser: 0.48 mg/dL — ABNORMAL LOW (ref 0.57–1.00)
Globulin, Total: 2.4 g/dL (ref 1.5–4.5)
Glucose: 105 mg/dL — ABNORMAL HIGH (ref 65–99)
Potassium: 4 mmol/L (ref 3.5–5.2)
Sodium: 135 mmol/L (ref 134–144)
Total Protein: 6.2 g/dL (ref 6.0–8.5)
eGFR: 132 mL/min/{1.73_m2} (ref >59)

## 2021-01-19 LAB — CBC
Hematocrit: 36.9 % (ref 34.0–46.6)
Hemoglobin: 12.2 g/dL (ref 11.1–15.9)
MCH: 30.2 pg (ref 26.6–33.0)
MCHC: 33.1 g/dL (ref 31.5–35.7)
MCV: 91 fL (ref 79–97)
Platelets: 183 10*3/uL (ref 150–450)
RBC: 4.04 x10E6/uL (ref 3.77–5.28)
RDW: 12.8 % (ref 11.7–15.4)
WBC: 9.6 10*3/uL (ref 3.4–10.8)

## 2021-01-19 LAB — HUMAN CHORIONIC GONADOTROPIN(HCG),B-SUBUNIT,QUANTITATIVE): HCG, Beta Chain, Quant, S: 39867 m[IU]/mL

## 2021-01-19 LAB — TSH: TSH: 0.902 u[IU]/mL (ref 0.450–4.500)

## 2021-01-23 ENCOUNTER — Ambulatory Visit: Payer: Medicaid Other

## 2021-02-08 ENCOUNTER — Other Ambulatory Visit: Payer: Self-pay

## 2021-02-08 ENCOUNTER — Encounter: Payer: Self-pay | Admitting: *Deleted

## 2021-02-08 ENCOUNTER — Other Ambulatory Visit: Payer: Self-pay | Admitting: *Deleted

## 2021-02-08 ENCOUNTER — Ambulatory Visit: Payer: Medicaid Other | Attending: Maternal & Fetal Medicine

## 2021-02-08 ENCOUNTER — Ambulatory Visit: Payer: Medicaid Other | Admitting: *Deleted

## 2021-02-08 VITALS — BP 111/65 | HR 101

## 2021-02-08 DIAGNOSIS — Z3A24 24 weeks gestation of pregnancy: Secondary | ICD-10-CM | POA: Diagnosis not present

## 2021-02-08 DIAGNOSIS — Z6841 Body Mass Index (BMI) 40.0 and over, adult: Secondary | ICD-10-CM | POA: Diagnosis present

## 2021-02-08 DIAGNOSIS — E669 Obesity, unspecified: Secondary | ICD-10-CM

## 2021-02-08 DIAGNOSIS — O99212 Obesity complicating pregnancy, second trimester: Secondary | ICD-10-CM | POA: Insufficient documentation

## 2021-02-23 ENCOUNTER — Other Ambulatory Visit: Payer: Self-pay

## 2021-02-23 ENCOUNTER — Ambulatory Visit (HOSPITAL_BASED_OUTPATIENT_CLINIC_OR_DEPARTMENT_OTHER): Payer: Medicaid Other | Admitting: *Deleted

## 2021-02-23 ENCOUNTER — Other Ambulatory Visit: Payer: Self-pay | Admitting: *Deleted

## 2021-02-23 ENCOUNTER — Ambulatory Visit: Payer: Medicaid Other | Attending: Obstetrics

## 2021-02-23 ENCOUNTER — Encounter: Payer: Self-pay | Admitting: *Deleted

## 2021-02-23 ENCOUNTER — Ambulatory Visit: Payer: Medicaid Other | Admitting: *Deleted

## 2021-02-23 VITALS — BP 112/72 | HR 98

## 2021-02-23 DIAGNOSIS — Z3A26 26 weeks gestation of pregnancy: Secondary | ICD-10-CM | POA: Insufficient documentation

## 2021-02-23 DIAGNOSIS — O365920 Maternal care for other known or suspected poor fetal growth, second trimester, not applicable or unspecified: Secondary | ICD-10-CM

## 2021-02-23 DIAGNOSIS — Z6841 Body Mass Index (BMI) 40.0 and over, adult: Secondary | ICD-10-CM | POA: Diagnosis present

## 2021-02-23 DIAGNOSIS — O365921 Maternal care for other known or suspected poor fetal growth, second trimester, fetus 1: Secondary | ICD-10-CM | POA: Diagnosis present

## 2021-02-23 DIAGNOSIS — O4692 Antepartum hemorrhage, unspecified, second trimester: Secondary | ICD-10-CM

## 2021-02-23 DIAGNOSIS — O99213 Obesity complicating pregnancy, third trimester: Secondary | ICD-10-CM | POA: Diagnosis not present

## 2021-02-23 DIAGNOSIS — O99212 Obesity complicating pregnancy, second trimester: Secondary | ICD-10-CM | POA: Diagnosis not present

## 2021-02-23 DIAGNOSIS — O4593 Premature separation of placenta, unspecified, third trimester: Secondary | ICD-10-CM | POA: Insufficient documentation

## 2021-02-23 DIAGNOSIS — O365931 Maternal care for other known or suspected poor fetal growth, third trimester, fetus 1: Secondary | ICD-10-CM | POA: Diagnosis not present

## 2021-02-23 DIAGNOSIS — E669 Obesity, unspecified: Secondary | ICD-10-CM

## 2021-02-23 NOTE — Procedures (Signed)
Karen Valencia 03-12-1991 [redacted]w[redacted]d  Fetus A Non-Stress Test Interpretation for 02/23/21  Indication: IUGR  Fetal Heart Rate A Mode: External Baseline Rate (A): 145 bpm Variability: Minimal, Moderate Accelerations: 10 x 10 Decelerations: Variable Multiple birth?: No  Uterine Activity Mode: Palpation, Toco Contraction Frequency (min): none Resting Tone Palpated: Relaxed  Interpretation (Fetal Testing) Nonstress Test Interpretation: Reactive Overall Impression: Reassuring for gestational age Comments: Dr. Parke Poisson reviewed tracing

## 2021-02-28 ENCOUNTER — Ambulatory Visit (HOSPITAL_BASED_OUTPATIENT_CLINIC_OR_DEPARTMENT_OTHER): Payer: Medicaid Other | Admitting: *Deleted

## 2021-02-28 ENCOUNTER — Encounter: Payer: Self-pay | Admitting: *Deleted

## 2021-02-28 ENCOUNTER — Other Ambulatory Visit: Payer: Self-pay

## 2021-02-28 ENCOUNTER — Ambulatory Visit: Payer: Medicaid Other | Attending: Obstetrics

## 2021-02-28 ENCOUNTER — Ambulatory Visit: Payer: Medicaid Other | Admitting: *Deleted

## 2021-02-28 VITALS — BP 123/69 | HR 103

## 2021-02-28 DIAGNOSIS — O365990 Maternal care for other known or suspected poor fetal growth, unspecified trimester, not applicable or unspecified: Secondary | ICD-10-CM

## 2021-02-28 DIAGNOSIS — O365920 Maternal care for other known or suspected poor fetal growth, second trimester, not applicable or unspecified: Secondary | ICD-10-CM

## 2021-02-28 DIAGNOSIS — O99213 Obesity complicating pregnancy, third trimester: Secondary | ICD-10-CM | POA: Diagnosis not present

## 2021-02-28 DIAGNOSIS — O4593 Premature separation of placenta, unspecified, third trimester: Secondary | ICD-10-CM | POA: Insufficient documentation

## 2021-02-28 DIAGNOSIS — E669 Obesity, unspecified: Secondary | ICD-10-CM | POA: Diagnosis not present

## 2021-02-28 DIAGNOSIS — Z3A27 27 weeks gestation of pregnancy: Secondary | ICD-10-CM | POA: Insufficient documentation

## 2021-02-28 DIAGNOSIS — O99212 Obesity complicating pregnancy, second trimester: Secondary | ICD-10-CM | POA: Diagnosis not present

## 2021-02-28 DIAGNOSIS — Z6841 Body Mass Index (BMI) 40.0 and over, adult: Secondary | ICD-10-CM

## 2021-02-28 DIAGNOSIS — O365930 Maternal care for other known or suspected poor fetal growth, third trimester, not applicable or unspecified: Secondary | ICD-10-CM | POA: Diagnosis not present

## 2021-02-28 NOTE — Procedures (Signed)
Karen Valencia 08/05/1990 [redacted]w[redacted]d  Fetus A Non-Stress Test Interpretation for 02/28/21  Indication: IUGR  Fetal Heart Rate A Mode: External Baseline Rate (A): 140 bpm Variability: Moderate Accelerations: 10 x 10 Decelerations: Variable Multiple birth?: No  Uterine Activity Mode: Toco Contraction Frequency (min): none Resting Tone Palpated: Relaxed  Interpretation (Fetal Testing) Nonstress Test Interpretation: Reactive Overall Impression: Reassuring for gestational age Comments: dr. Grace Bushy reviewed tracing

## 2021-03-08 ENCOUNTER — Other Ambulatory Visit: Payer: Medicaid Other

## 2021-03-08 NOTE — Addendum Note (Signed)
Addended by: [name] on: 03/08/2021 01:06 PM   Modules accepted: Level of Service

## 2021-03-09 ENCOUNTER — Ambulatory Visit: Payer: Medicaid Other

## 2021-03-15 ENCOUNTER — Other Ambulatory Visit: Payer: Medicaid Other

## 2021-03-15 ENCOUNTER — Ambulatory Visit: Payer: Medicaid Other

## 2021-03-20 ENCOUNTER — Other Ambulatory Visit: Payer: Medicaid Other

## 2021-03-20 ENCOUNTER — Ambulatory Visit: Payer: Medicaid Other

## 2021-03-29 ENCOUNTER — Encounter: Payer: Self-pay | Admitting: *Deleted

## 2021-03-29 ENCOUNTER — Ambulatory Visit: Payer: Medicaid Other | Admitting: *Deleted

## 2021-03-29 ENCOUNTER — Other Ambulatory Visit: Payer: Self-pay | Admitting: *Deleted

## 2021-03-29 ENCOUNTER — Ambulatory Visit: Payer: Medicaid Other | Attending: Maternal & Fetal Medicine

## 2021-03-29 ENCOUNTER — Other Ambulatory Visit: Payer: Self-pay

## 2021-03-29 VITALS — BP 105/65 | HR 93

## 2021-03-29 DIAGNOSIS — E669 Obesity, unspecified: Secondary | ICD-10-CM

## 2021-03-29 DIAGNOSIS — Z3A31 31 weeks gestation of pregnancy: Secondary | ICD-10-CM | POA: Diagnosis not present

## 2021-03-29 DIAGNOSIS — O99213 Obesity complicating pregnancy, third trimester: Secondary | ICD-10-CM

## 2021-03-29 DIAGNOSIS — O365920 Maternal care for other known or suspected poor fetal growth, second trimester, not applicable or unspecified: Secondary | ICD-10-CM

## 2021-03-29 DIAGNOSIS — O99212 Obesity complicating pregnancy, second trimester: Secondary | ICD-10-CM

## 2021-03-29 DIAGNOSIS — O365921 Maternal care for other known or suspected poor fetal growth, second trimester, fetus 1: Secondary | ICD-10-CM | POA: Diagnosis not present

## 2021-04-19 ENCOUNTER — Ambulatory Visit: Payer: Medicaid Other | Admitting: *Deleted

## 2021-04-19 ENCOUNTER — Other Ambulatory Visit: Payer: Self-pay

## 2021-04-19 ENCOUNTER — Ambulatory Visit: Payer: Medicaid Other | Attending: Maternal & Fetal Medicine

## 2021-04-19 VITALS — BP 117/74 | HR 90

## 2021-04-19 DIAGNOSIS — E669 Obesity, unspecified: Secondary | ICD-10-CM | POA: Diagnosis not present

## 2021-04-19 DIAGNOSIS — Z3A33 33 weeks gestation of pregnancy: Secondary | ICD-10-CM | POA: Diagnosis not present

## 2021-04-19 DIAGNOSIS — O99213 Obesity complicating pregnancy, third trimester: Secondary | ICD-10-CM | POA: Insufficient documentation

## 2021-04-26 ENCOUNTER — Other Ambulatory Visit: Payer: Self-pay

## 2021-04-26 ENCOUNTER — Ambulatory Visit: Payer: Medicaid Other | Admitting: *Deleted

## 2021-04-26 ENCOUNTER — Ambulatory Visit: Payer: Medicaid Other | Attending: Maternal & Fetal Medicine

## 2021-04-26 VITALS — BP 114/71 | HR 97

## 2021-04-26 DIAGNOSIS — O99213 Obesity complicating pregnancy, third trimester: Secondary | ICD-10-CM | POA: Insufficient documentation

## 2021-04-26 DIAGNOSIS — O365931 Maternal care for other known or suspected poor fetal growth, third trimester, fetus 1: Secondary | ICD-10-CM

## 2021-04-26 DIAGNOSIS — O365930 Maternal care for other known or suspected poor fetal growth, third trimester, not applicable or unspecified: Secondary | ICD-10-CM | POA: Diagnosis not present

## 2021-04-26 DIAGNOSIS — Z3A35 35 weeks gestation of pregnancy: Secondary | ICD-10-CM | POA: Diagnosis not present

## 2021-04-26 DIAGNOSIS — E669 Obesity, unspecified: Secondary | ICD-10-CM

## 2021-04-26 NOTE — Procedures (Signed)
Karen Valencia 28-Apr-1991 [redacted]w[redacted]d  Fetus A Non-Stress Test Interpretation for 04/26/21  Indication: IUGR  Fetal Heart Rate A Mode: External Baseline Rate (A): 135 bpm Variability: Moderate Accelerations: 15 x 15 Decelerations: None  Uterine Activity Mode: Toco Contraction Frequency (min): UI Resting Tone Palpated: Relaxed  Interpretation (Fetal Testing) Nonstress Test Interpretation: Reactive Overall Impression: Reassuring for gestational age Comments: tracing reviewed by Dr. Judeth Cornfield

## 2021-04-27 ENCOUNTER — Other Ambulatory Visit: Payer: Self-pay | Admitting: *Deleted

## 2021-04-27 DIAGNOSIS — Z6841 Body Mass Index (BMI) 40.0 and over, adult: Secondary | ICD-10-CM

## 2021-04-29 NOTE — L&D Delivery Note (Signed)
Delivery Note Kwynn Schlotter is a [id number] at [redacted]w[redacted]d who had a spontaneous delivery at 0318 a viable female was delivered via ROA.  APGAR: 6, 7; weight 6lb8.1oz (2950g)  .     Admitted for IOL for BPP 4/8. Induced with pit and AROM. Progressed quickly after AROM. Received epidural for pain management.   Called at [phone] and notified of recurrent late decelerations not responding to position change. Prior to this point, was having early/variable decelerations. At bedside, I examined the patient and she was found to be fully dilated. Pushed for 2 minutes. Baby was delivered without difficulty. Tight nuchal cord reduced after delivery of infant body.  Delayed cord clamping for 60 seconds. Delivery of placenta was spontaneous. Placenta was found to be intact with 5cm blood clot adherent to surface, 3 -vessel cord was noted. The fundus was found to be firm. Perineum intact. Estimated blood loss 150cc. Instrument and gauze counts were correct at the end of the procedure.  After the cord was clamped and cut, infant taken to warmer for resuscitation due to poor oxygenation: PPV, suctioning.    Placenta status: to pathology  Anesthesia:  epidural Episiotomy:  none Lacerations:  none Suture Repair: N/A Est. Blood Loss (mL):  150  Mom to postpartum.  Baby to Couplet care / Skin to Skin.  Charlett Nose 05/16/2021, 3:49 AM

## 2021-05-01 ENCOUNTER — Other Ambulatory Visit: Payer: Self-pay | Admitting: Maternal & Fetal Medicine

## 2021-05-01 DIAGNOSIS — O99213 Obesity complicating pregnancy, third trimester: Secondary | ICD-10-CM

## 2021-05-03 ENCOUNTER — Ambulatory Visit: Payer: Medicaid Other | Attending: Maternal & Fetal Medicine

## 2021-05-03 ENCOUNTER — Ambulatory Visit: Payer: Medicaid Other

## 2021-05-09 ENCOUNTER — Ambulatory Visit: Payer: Medicaid Other

## 2021-05-09 ENCOUNTER — Ambulatory Visit: Payer: Medicaid Other | Attending: Obstetrics and Gynecology

## 2021-05-15 ENCOUNTER — Encounter (HOSPITAL_COMMUNITY): Payer: Self-pay | Admitting: Obstetrics and Gynecology

## 2021-05-15 ENCOUNTER — Inpatient Hospital Stay (HOSPITAL_COMMUNITY): Payer: Medicaid Other | Admitting: Anesthesiology

## 2021-05-15 ENCOUNTER — Other Ambulatory Visit: Payer: Self-pay

## 2021-05-15 ENCOUNTER — Inpatient Hospital Stay (HOSPITAL_COMMUNITY)
Admission: AD | Admit: 2021-05-15 | Discharge: 2021-05-17 | DRG: 805 | Disposition: A | Discharge status: Home / Self Care | Payer: Medicaid Other | Attending: Obstetrics and Gynecology | Admitting: Obstetrics and Gynecology

## 2021-05-15 DIAGNOSIS — O368130 Decreased fetal movements, third trimester, not applicable or unspecified: Secondary | ICD-10-CM | POA: Diagnosis present

## 2021-05-15 DIAGNOSIS — O4593 Premature separation of placenta, unspecified, third trimester: Secondary | ICD-10-CM | POA: Diagnosis present

## 2021-05-15 DIAGNOSIS — O368190 Decreased fetal movements, unspecified trimester, not applicable or unspecified: Principal | ICD-10-CM | POA: Diagnosis present

## 2021-05-15 DIAGNOSIS — Z20822 Contact with and (suspected) exposure to covid-19: Secondary | ICD-10-CM | POA: Diagnosis present

## 2021-05-15 DIAGNOSIS — F419 Anxiety disorder, unspecified: Secondary | ICD-10-CM | POA: Diagnosis present

## 2021-05-15 DIAGNOSIS — O99214 Obesity complicating childbirth: Secondary | ICD-10-CM | POA: Diagnosis present

## 2021-05-15 DIAGNOSIS — Z3A38 38 weeks gestation of pregnancy: Secondary | ICD-10-CM

## 2021-05-15 DIAGNOSIS — F32A Depression, unspecified: Secondary | ICD-10-CM | POA: Diagnosis present

## 2021-05-15 DIAGNOSIS — O365930 Maternal care for other known or suspected poor fetal growth, third trimester, not applicable or unspecified: Secondary | ICD-10-CM | POA: Diagnosis present

## 2021-05-15 DIAGNOSIS — O99344 Other mental disorders complicating childbirth: Secondary | ICD-10-CM | POA: Diagnosis present

## 2021-05-15 LAB — RESP PANEL BY RT-PCR (FLU A&B, COVID) ARPGX2
Influenza A by PCR: NEGATIVE
Influenza B by PCR: NEGATIVE
SARS Coronavirus 2 by RT PCR: NEGATIVE

## 2021-05-15 LAB — OB RESULTS CONSOLE HEPATITIS B SURFACE ANTIGEN: Hepatitis B Surface Ag: NEGATIVE

## 2021-05-15 LAB — CBC
HCT: 40.5 % (ref 36.0–46.0)
Hemoglobin: 13.1 g/dL (ref 12.0–15.0)
MCH: 29.4 pg (ref 26.0–34.0)
MCHC: 32.3 g/dL (ref 30.0–36.0)
MCV: 90.8 fL (ref 80.0–100.0)
Platelets: 174 10*3/uL (ref 150–400)
RBC: 4.46 MIL/uL (ref 3.87–5.11)
RDW: 14 % (ref 11.5–15.5)
WBC: 9.2 10*3/uL (ref 4.0–10.5)
nRBC: 0 % (ref 0.0–0.2)

## 2021-05-15 LAB — OB RESULTS CONSOLE GBS: GBS: NEGATIVE

## 2021-05-15 LAB — OB RESULTS CONSOLE ABO/RH: RH Type: POSITIVE

## 2021-05-15 LAB — OB RESULTS CONSOLE GC/CHLAMYDIA
Chlamydia: NEGATIVE
Gonorrhea: NEGATIVE

## 2021-05-15 LAB — OB RESULTS CONSOLE HIV ANTIBODY (ROUTINE TESTING): HIV: NONREACTIVE

## 2021-05-15 LAB — OB RESULTS CONSOLE RUBELLA ANTIBODY, IGM: Rubella: IMMUNE

## 2021-05-15 LAB — OB RESULTS CONSOLE ANTIBODY SCREEN: Antibody Screen: NEGATIVE

## 2021-05-15 LAB — TYPE AND SCREEN
ABO/RH(D): O POS
Antibody Screen: NEGATIVE

## 2021-05-15 LAB — OB RESULTS CONSOLE RPR: RPR: NONREACTIVE

## 2021-05-15 MED ORDER — TERBUTALINE SULFATE 1 MG/ML IJ SOLN: 0.2500 mg | Freq: Once | INTRAMUSCULAR | Status: DC | PRN

## 2021-05-15 MED ORDER — FAMOTIDINE 20 MG PO TABS: 20.0000 mg | ORAL_TABLET | Freq: Two times a day (BID) | ORAL | Status: DC

## 2021-05-15 MED ORDER — EPHEDRINE 5 MG/ML INJ: 10.0000 mg | INTRAVENOUS | Status: DC | PRN

## 2021-05-15 MED ORDER — FENTANYL CITRATE (PF) 100 MCG/2ML IJ SOLN: 50.0000 ug | INTRAMUSCULAR | Status: DC | PRN

## 2021-05-15 MED ORDER — FENTANYL-BUPIVACAINE-NACL 0.5-0.125-0.9 MG/250ML-% EP SOLN
12.0000 mL/h | EPIDURAL | Status: DC | PRN
  Filled 2021-05-15: qty 250

## 2021-05-15 MED ORDER — LIDOCAINE HCL (PF) 1 % IJ SOLN
INTRAMUSCULAR | Status: DC | PRN
  Administered 2021-05-15: 10 mL via EPIDURAL
  Administered 2021-05-15: 2 mL via EPIDURAL

## 2021-05-15 MED ORDER — ONDANSETRON HCL 4 MG/2ML IJ SOLN: 4.0000 mg | Freq: Four times a day (QID) | INTRAMUSCULAR | Status: DC | PRN

## 2021-05-15 MED ORDER — PHENYLEPHRINE 40 MCG/ML (10ML) SYRINGE FOR IV PUSH (FOR BLOOD PRESSURE SUPPORT): 80.0000 ug | PREFILLED_SYRINGE | INTRAVENOUS | Status: DC | PRN

## 2021-05-15 MED ORDER — FENTANYL-BUPIVACAINE-NACL 0.5-0.125-0.9 MG/250ML-% EP SOLN
EPIDURAL | Status: DC | PRN
  Administered 2021-05-15: 12 mL/h via EPIDURAL

## 2021-05-15 MED ORDER — OXYTOCIN-SODIUM CHLORIDE 30-0.9 UT/500ML-% IV SOLN
1.0000 m[IU]/min | INTRAVENOUS | Status: DC
  Administered 2021-05-15: 2 m[IU]/min via INTRAVENOUS

## 2021-05-15 MED ORDER — OXYTOCIN-SODIUM CHLORIDE 30-0.9 UT/500ML-% IV SOLN
2.5000 [IU]/h | INTRAVENOUS | Status: DC
  Filled 2021-05-15: qty 500

## 2021-05-15 MED ORDER — ACETAMINOPHEN 325 MG PO TABS
650.0000 mg | ORAL_TABLET | ORAL | Status: DC | PRN
  Administered 2021-05-16: 650 mg via ORAL
  Filled 2021-05-15: qty 2

## 2021-05-15 MED ORDER — OXYCODONE-ACETAMINOPHEN 5-325 MG PO TABS: 1.0000 | ORAL_TABLET | ORAL | Status: DC | PRN

## 2021-05-15 MED ORDER — LACTATED RINGERS IV SOLN: 500.0000 mL | INTRAVENOUS | Status: DC | PRN

## 2021-05-15 MED ORDER — DIPHENHYDRAMINE HCL 50 MG/ML IJ SOLN: 12.5000 mg | INTRAMUSCULAR | Status: DC | PRN

## 2021-05-15 MED ORDER — SOD CITRATE-CITRIC ACID 500-334 MG/5ML PO SOLN: 30.0000 mL | ORAL | Status: DC | PRN

## 2021-05-15 MED ORDER — LACTATED RINGERS IV SOLN: INTRAVENOUS | Status: DC

## 2021-05-15 MED ORDER — OXYCODONE-ACETAMINOPHEN 5-325 MG PO TABS: 2.0000 | ORAL_TABLET | ORAL | Status: DC | PRN

## 2021-05-15 MED ORDER — OXYTOCIN BOLUS FROM INFUSION
333.0000 mL | Freq: Once | INTRAVENOUS | Status: AC
  Administered 2021-05-16: 333 mL via INTRAVENOUS

## 2021-05-15 MED ORDER — LIDOCAINE HCL (PF) 1 % IJ SOLN: 30.0000 mL | INTRAMUSCULAR | Status: DC | PRN

## 2021-05-15 MED ORDER — LACTATED RINGERS IV SOLN: 500.0000 mL | Freq: Once | INTRAVENOUS | Status: DC

## 2021-05-15 NOTE — Anesthesia Procedure Notes (Signed)
Epidural Patient location during procedure: OB Start time: 05/15/2021 9:49 PM End time: 05/15/2021 9:56 PM  Staffing Anesthesiologist: Pervis Hocking, DO Performed: anesthesiologist   Preanesthetic Checklist Completed: patient identified, IV checked, risks and benefits discussed, monitors and equipment checked, pre-op evaluation and timeout performed  Epidural Patient position: sitting Prep: DuraPrep and site prepped and draped Patient monitoring: continuous pulse ox, blood pressure, heart rate and cardiac monitor Approach: midline Location: L3-L4 Injection technique: LOR air  Needle:  Needle type: Tuohy  Needle gauge: 17 G Needle length: 9 cm Needle insertion depth: 7 cm Catheter type: closed end flexible Catheter size: 19 Gauge Catheter at skin depth: 12 cm Test dose: negative  Assessment Sensory level: T8 Events: blood not aspirated, injection not painful, no injection resistance, no paresthesia and negative IV test  Additional Notes Patient identified. Risks/Benefits/Options discussed with patient including but not limited to bleeding, infection, nerve damage, paralysis, failed block, incomplete pain control, headache, blood pressure changes, nausea, vomiting, reactions to medication both or allergic, itching and postpartum back pain. Confirmed with bedside nurse the patient's most recent platelet count. Confirmed with patient that they are not currently taking any anticoagulation, have any bleeding history or any family history of bleeding disorders. Patient expressed understanding and wished to proceed. All questions were answered. Sterile technique was used throughout the entire procedure. Please see nursing notes for vital signs. Test dose was given through epidural catheter and negative prior to continuing to dose epidural or start infusion. Warning signs of high block given to the patient including shortness of breath, tingling/numbness in hands, complete motor  block, or any concerning symptoms with instructions to call for help. Patient was given instructions on fall risk and not to get out of bed. All questions and concerns addressed with instructions to call with any issues or inadequate analgesia.  Reason for block:procedure for pain

## 2021-05-15 NOTE — Anesthesia Preprocedure Evaluation (Signed)
Anesthesia Evaluation  Patient identified by MRN, date of birth, ID band Patient awake    Reviewed: Allergy & Precautions, Patient's Chart, lab work & pertinent test results  Airway Mallampati: II  TM Distance: >3 FB Neck ROM: Full    Dental no notable dental hx.    Pulmonary neg pulmonary ROS,    Pulmonary exam normal breath sounds clear to auscultation       Cardiovascular negative cardio ROS Normal cardiovascular exam Rhythm:Regular Rate:Normal     Neuro/Psych Seizures - (childhood), Well Controlled,  PSYCHIATRIC DISORDERS Anxiety Depression    GI/Hepatic negative GI ROS, Neg liver ROS,   Endo/Other  Morbid obesityBMI 52  Renal/GU negative Renal ROS  negative genitourinary   Musculoskeletal negative musculoskeletal ROS (+)   Abdominal (+) + obese,   Peds negative pediatric ROS (+)  Hematology negative hematology ROS (+) hct 40.5, plt 174   Anesthesia Other Findings   Reproductive/Obstetrics (+) Pregnancy Chronic abruption, stable                             Anesthesia Physical Anesthesia Plan  ASA: 3  Anesthesia Plan: Epidural   Post-op Pain Management:    Induction:   PONV Risk Score and Plan: 2  Airway Management Planned: Natural Airway  Additional Equipment: None  Intra-op Plan:   Post-operative Plan:   Informed Consent: I have reviewed the patients History and Physical, chart, labs and discussed the procedure including the risks, benefits and alternatives for the proposed anesthesia with the patient or authorized representative who has indicated his/her understanding and acceptance.       Plan Discussed with:   Anesthesia Plan Comments:         Anesthesia Quick Evaluation

## 2021-05-15 NOTE — H&P (Signed)
Karen Valencia is a 31 y.o. female [phone] [redacted]w[redacted]d presenting for direct admission after office BPP 4/8 (-2 fetal movements and tone). She has been doing weekly BPPs due to obesity and chronic placental abruption during this pregnancy. She states she feels fetal movements but does endorse movements have been less than usual over the last couple days.  She reports LOF, VB, Contractions.   Pregnancy c/b: Chronic placental abruption: subchorionic hematoma noted on first OB visit, diagnosed with chronic abruption at 24 weeks on MFM scan. Noted to be stable on subsequent scans. IUGR, resolved: EFW 7% at 24 weeks, resolved by 27 weeks. Most recent growth at 35 weeks = EFW 42% (5lb11oz = 2591g) Obesity: BMI 47.6, monitoring as noted above Anxiety/depression: stable on zoloft 50mg  Single elevated BP at 1/10 visit, attributed to stress at home. BP normal in office today.   OB History     Gravida  4   Para  2   Term  2   Preterm      AB  1   Living  2      SAB  1   IAB      Ectopic      Multiple  0   Live Births  1          Past Medical History:  Diagnosis Date   Anxiety    Depression    Seizures (HCC)    childhood   Past Surgical History:  Procedure Laterality Date   NO PAST SURGERIES     Family History: family history includes Alcohol abuse in her father; Heart disease in her mother; Hypertension in her mother. Social History:  reports that she has never smoked. She has never used smokeless tobacco. She reports that she does not currently use alcohol. She reports that she does not use drugs.     Maternal Diabetes: No Genetic Screening: Normal Maternal Ultrasounds/Referrals: Other: chronic abruption, stable; IUGR, stable Fetal Ultrasounds or other Referrals:  Referred to Materal Fetal Medicine  Maternal Substance Abuse:  No Significant Maternal Medications:  Meds include: Zoloft Significant Maternal Lab Results:  Group B Strep negative Other Comments:   None  Review of Systems Per HPI Exam Physical Exam  Dilation: 2.5 Effacement (%): 50 Station: -3 Exam by:: [email or web address], rn Blood pressure 131/75, pulse 95, resp. rate 16, weight (!) (P) 138.1 kg, last menstrual period 08/22/2020, unknown if currently breastfeeding. Gen: NAD, resting comfortably CVS: normal pulses Lungs: nonlabored respirations Abd: Gravid abdomen, obese Ext: no calf edema or tenderness  Fetal testing: 145bpm, moderate variability, + accels, no decels Toco: acontractile  Prenatal labs: ABO, Rh:  --/--/PENDING (01/17 1605) Antibody: PENDING (01/17 1605) Rubella: Immune (01/17 0000) RPR: Nonreactive (01/17 0000)  HBsAg: Negative (01/17 0000)  HIV: Non-reactive (01/17 0000)  GBS: Negative/-- (01/17 0000)   Assessment/Plan: 05-02-1993 @ [redacted]w[redacted]d, admit for IOL for BPP 4/8 at term Fetal wellbeing: cat I tracing, continuous fetal monitoring IOL: titrate pitocin per protocol. Plan AROM when fetal vertex engaged Increased risk for PPH, given obesity, chronic abruption: low threshold for uterotonics at delivery Pain control: epidural upon patient request   [redacted]w[redacted]d 05/15/2021, 5:13 PM

## 2021-05-16 ENCOUNTER — Encounter (HOSPITAL_COMMUNITY): Payer: Self-pay | Admitting: Obstetrics and Gynecology

## 2021-05-16 LAB — RPR: RPR Ser Ql: NONREACTIVE

## 2021-05-16 MED ORDER — FLEET ENEMA 7-19 GM/118ML RE ENEM: 1.0000 | ENEMA | Freq: Every day | RECTAL | Status: DC | PRN

## 2021-05-16 MED ORDER — OXYCODONE HCL 5 MG PO TABS: 5.0000 mg | ORAL_TABLET | ORAL | Status: DC | PRN

## 2021-05-16 MED ORDER — BISACODYL 10 MG RE SUPP: 10.0000 mg | Freq: Every day | RECTAL | Status: DC | PRN

## 2021-05-16 MED ORDER — TETANUS-DIPHTH-ACELL PERTUSSIS 5-2.5-18.5 LF-MCG/0.5 IM SUSY: 0.5000 mL | PREFILLED_SYRINGE | Freq: Once | INTRAMUSCULAR | Status: DC

## 2021-05-16 MED ORDER — WITCH HAZEL-GLYCERIN EX PADS: 1.0000 "application " | MEDICATED_PAD | CUTANEOUS | Status: DC | PRN

## 2021-05-16 MED ORDER — DIBUCAINE (PERIANAL) 1 % EX OINT: 1.0000 "application " | TOPICAL_OINTMENT | CUTANEOUS | Status: DC | PRN

## 2021-05-16 MED ORDER — COCONUT OIL OIL: 1.0000 "application " | TOPICAL_OIL | Status: DC | PRN

## 2021-05-16 MED ORDER — ACETAMINOPHEN 325 MG PO TABS: 650.0000 mg | ORAL_TABLET | ORAL | Status: DC | PRN

## 2021-05-16 MED ORDER — PRENATAL MULTIVITAMIN CH
1.0000 | ORAL_TABLET | Freq: Every day | ORAL | Status: DC
  Administered 2021-05-16: 1 via ORAL
  Filled 2021-05-16: qty 1

## 2021-05-16 MED ORDER — BENZOCAINE-MENTHOL 20-0.5 % EX AERO: 1.0000 "application " | INHALATION_SPRAY | CUTANEOUS | Status: DC | PRN

## 2021-05-16 MED ORDER — DOCUSATE SODIUM 100 MG PO CAPS: 100.0000 mg | ORAL_CAPSULE | Freq: Two times a day (BID) | ORAL | Status: DC

## 2021-05-16 MED ORDER — ONDANSETRON HCL 4 MG PO TABS: 4.0000 mg | ORAL_TABLET | ORAL | Status: DC | PRN

## 2021-05-16 MED ORDER — SIMETHICONE 80 MG PO CHEW: 80.0000 mg | CHEWABLE_TABLET | ORAL | Status: DC | PRN

## 2021-05-16 MED ORDER — IBUPROFEN 600 MG PO TABS
600.0000 mg | ORAL_TABLET | Freq: Four times a day (QID) | ORAL | Status: DC
  Administered 2021-05-16 (×3): 600 mg via ORAL
  Filled 2021-05-16 (×4): qty 1

## 2021-05-16 MED ORDER — ONDANSETRON HCL 4 MG/2ML IJ SOLN: 4.0000 mg | INTRAMUSCULAR | Status: DC | PRN

## 2021-05-16 MED ORDER — OXYCODONE HCL 5 MG PO TABS: 10.0000 mg | ORAL_TABLET | ORAL | Status: DC | PRN

## 2021-05-16 MED ORDER — DIPHENHYDRAMINE HCL 25 MG PO CAPS: 25.0000 mg | ORAL_CAPSULE | Freq: Four times a day (QID) | ORAL | Status: DC | PRN

## 2021-05-16 NOTE — Social Work (Signed)
MOB was referred for history of depression and anxiety.  ° °* Referral screened out by Clinical Social Worker because none of the following criteria appear to apply: ° °~ History of anxiety/depression during this pregnancy, or of post-partum depression following prior delivery. °~ Diagnosis of anxiety and/or depression within last 3 years. °OR °* MOB's symptoms currently being treated with medication and/or therapy. MOB currently prescribed Zoloft 50mg. ° °Please contact the Clinical Social Worker if needs arise, by MOB request, or if MOB scores greater than 9/yes to question 10 on Edinburgh Postpartum Depression Screen. ° °Dhalia Zingaro, LCSWA °Clinical Social Work °[facility]  °[phone]  °

## 2021-05-16 NOTE — Anesthesia Postprocedure Evaluation (Signed)
Anesthesia Post Note  Patient: Karen Valencia  Procedure(s) Performed: AN AD HOC LABOR EPIDURAL     Patient location during evaluation: Mother Baby Anesthesia Type: Epidural Level of consciousness: awake and alert and oriented Pain management: satisfactory to patient Vital Signs Assessment: post-procedure vital signs reviewed and stable Respiratory status: respiratory function stable Cardiovascular status: stable Postop Assessment: no headache, no backache, epidural receding, patient able to bend at knees, no signs of nausea or vomiting, adequate PO intake and able to ambulate Anesthetic complications: no   No notable events documented.  Last Vitals:  Vitals:   05/16/21 0613 05/16/21 1103  BP: 118/64 130/64  Pulse: 88 80  Resp: 18 17  Temp: 36.9 C 36.7 C  SpO2: 99%     Last Pain:  Vitals:   05/16/21 1103  TempSrc: Oral  PainSc: 0-No pain   Pain Goal:                   [name]

## 2021-05-16 NOTE — Lactation Note (Signed)
This note was copied from a baby's chart. Lactation Consultation Note Mom trying to latch baby when [facility] entered. LC assisted in latching baby. Mom stated she formula fed her other 2 ages 35 and 29 yrs old. Mom stated she has been leaking a long time. LC praised mom and encouraged mom to BF. Mom denies painful latch. Will be f/u on MBU.  Patient Name: Girl Keoshia Steinmetz HTDSK'A Date: 05/16/2021 Reason for consult: L&D Initial assessment;1st time breastfeeding;Early term 37-38.6wks Age:29 hours  Maternal Data    Feeding    LATCH Score Latch: Grasps breast easily, tongue down, lips flanged, rhythmical sucking.  Audible Swallowing: A few with stimulation  Type of Nipple: Everted at rest and after stimulation  Comfort (Breast/Nipple): Soft / non-tender  Hold (Positioning): Assistance needed to correctly position infant at breast and maintain latch.  LATCH Score: 8   Lactation Tools Discussed/Used    Interventions    Discharge    Consult Status Consult Status: Follow-up from L&D Date: 05/16/21 Follow-up type: In-patient    Charyl Dancer 05/16/2021, 4:22 AM

## 2021-05-16 NOTE — Progress Notes (Signed)
Post Partum Day 0 Subjective: up ad lib, voiding, tolerating PO, + flatus, and lochia mild. Feels fatigued as expected. Breastfeeding and bonding well with baby . She denies HA, CP and SOB. She would like discharge to home tomorrow if possible.   Objective: Blood pressure 118/64, pulse 88, temperature 98.5 F (36.9 C), temperature source Oral, resp. rate 18, height 5\' 4"  (1.626 m), weight (!) (P) 138.1 kg, last menstrual period 08/22/2020, SpO2 99 %, unknown if currently breastfeeding.  Physical Exam:  General: alert, cooperative, and no distress Lochia: appropriate Uterine Fundus: firm Incision: n/a DVT Evaluation: No evidence of DVT seen on physical exam.  Recent Labs    05/15/21 1605  HGB 13.1  HCT 40.5    Assessment/Plan: Plan for discharge tomorrow and Breastfeeding Unsure contraception Routine pp care    LOS: 1 day   Karen Valencia Karen Valencia 05/16/2021, 10:13 AM

## 2021-05-16 NOTE — Progress Notes (Signed)
OB Progress Note  S: Pt comfortable with epidural   O: Today's Vitals   05/15/21 2302 05/15/21 2331 05/16/21 0002 05/16/21 0036  BP: 119/69 122/75 106/70   Pulse: 72 76 72   Resp:      Temp:      TempSrc:      SpO2:      Weight:      Height:      PainSc:    9    Body mass index is 52.25 kg/m (pended).  SVE 4/70/-2, cervix very anterior and to patient's left. AROM clear fluid. IUPC and FSE placed  FHR: 120bpm, mod variability, + accels, small variable decels with last few contractions Toco: ctx q 2-3 min   A/P: [name] @ [redacted]w[redacted]d, admitted for IOL for BPP 4/8 at term Fetal wellbeing: cat I-2 tracing, overall reassuring, continuous fetal monitoring IOL: s/p AROM, IUPC in place, titrate pirocin Increased risk for PPH, given obesity, chronic abruption: low threshold for uterotonics at delivery Pain control: epidural   M. Timothy Lasso, MD 05/16/21 12:45 AM

## 2021-05-16 NOTE — Lactation Note (Signed)
This note was copied from a baby's chart. Lactation Consultation Note  Patient Name: Karen Valencia [id number]'S Date: 05/16/2021 Reason for consult: Follow-up assessment;1st time breastfeeding;Early term 37-38.6wks;Maternal endocrine disorder Age:29 hours Per mom has been leaking for 2 months and able to hand express.  LC checked and changed a medium to large wet.  LC placed the baby STS on the left breast, baby rooting, and latched easily with depth. Worked with mom with the tea hold position. Baby fed 14 mins with multiple swallow/ increased with breast compressions. Latch score 9.  Baby satisfied when she released.  LC reassured both mom and dad baby is breast feeding well and reviewed the doc flow sheets. WNL for age.  LC plan:  Feed with feeding cues , and by 3 hours STS. ( 8-12 times in 24 hours - breast feeding goal).   LC discussed with mom the potential effects on milk supply with dx of PCOS and recommended following up with LC O/P Jasmine December Hice IBCLC @[facility] for pre and post weight check in about a week or so.  LC offered to request the [facility] O/P and mom receptive.  LC placed a request today for [facility] to call mom and mom aware she will receive a call.   Maternal Data Has patient been taught Hand Expression?: Yes Does the patient have breastfeeding experience prior to this delivery?: No Per mom did not breastfeed her 1st  two kids. Breast changes did occur with all three , leaked with her 2nd baby and this baby for 2 months prior to delivery.  Feeding Mother's Current Feeding Choice: Breast Milk  LATCH Score Latch: Grasps breast easily, tongue down, lips flanged, rhythmical sucking.  Audible Swallowing: Spontaneous and intermittent  Type of Nipple: Everted at rest and after stimulation  Comfort (Breast/Nipple): Soft / non-tender  Hold (Positioning): Assistance needed to correctly position infant at breast and maintain latch.  LATCH Score: 9   Lactation Tools  Discussed/Used    Interventions Interventions: Breast feeding basics reviewed;Assisted with latch;Breast massage;Hand express;Breast compression;Adjust position;Support pillows;Position options;Education;LC Services brochure  Discharge Pump: Personal;DEBP  Consult Status Consult Status: Follow-up Date: 05/17/21 Follow-up type: In-patient    05/19/21 Comer Devins 05/16/2021, 12:32 PM

## 2021-05-17 ENCOUNTER — Ambulatory Visit: Payer: Medicaid Other

## 2021-05-17 LAB — CBC
HCT: 35 % — ABNORMAL LOW (ref 36.0–46.0)
Hemoglobin: 11.7 g/dL — ABNORMAL LOW (ref 12.0–15.0)
MCH: 30.5 pg (ref 26.0–34.0)
MCHC: 33.4 g/dL (ref 30.0–36.0)
MCV: 91.1 fL (ref 80.0–100.0)
Platelets: 136 10*3/uL — ABNORMAL LOW (ref 150–400)
RBC: 3.84 MIL/uL — ABNORMAL LOW (ref 3.87–5.11)
RDW: 14.2 % (ref 11.5–15.5)
WBC: 9.5 10*3/uL (ref 4.0–10.5)
nRBC: 0 % (ref 0.0–0.2)

## 2021-05-17 NOTE — Progress Notes (Signed)
Patient is doing well.  She is ambulating, voiding, tolerating PO.  Pain control is good.  Lochia is appropriate  Vitals:   05/16/21 1103 05/16/21 1700 05/16/21 2100 05/17/21 0531  BP: 130/64 128/83 127/78 112/76  Pulse: 80 95 93 85  Resp: 17     Temp: 98 F (36.7 C) (!) 97.5 F (36.4 C) 98.1 F (36.7 C)   TempSrc: Oral Oral    SpO2:  97%    Weight:      Height:        NAD Fundus firm Ext: 1+ edema bilaterally  Lab Results  Component Value Date   WBC 9.5 05/17/2021   HGB 11.7 (L) 05/17/2021   HCT 35.0 (L) 05/17/2021   MCV 91.1 05/17/2021   PLT 136 (L) 05/17/2021    --/--/O POS (01/17 1605)/RImmune  A/P 30 y.o. [id number] PPD#1. Routine care.   Anxiety/depression: on zoloft 50mg  daily.  Doing well, but worried about worsening symptoms in the postpartum period.  Will plan short interval f/u in the office Expect d/c today.    [facility]

## 2021-05-17 NOTE — Lactation Note (Signed)
This note was copied from a baby's chart. Lactation Consultation Note  Patient Name: Karen Valencia Date: 05/17/2021 Reason for consult: Follow-up assessment;Early term 37-38.6wks;Other (Comment) (6 % weight loss, per mom and RN baby has been cluster feeding during the night and this am. LC reviewed BF D/C teaching. mom aware the [facility] O/P clinic with call with appt.) Age:29 hours  Maternal Data    Feeding Mother's Current Feeding Choice: Breast Milk and Formula  LATCH Score                    Lactation Tools Discussed/Used    Interventions    Discharge Discharge Education: Engorgement and breast care;Warning signs for feeding baby;Outpatient recommendation;Outpatient Epic message sent;Other (comment) (placed in Epic 1/18) Pump: Personal;DEBP  Consult Status Consult Status: Complete Date: 05/17/21    Karen Valencia 05/17/2021, 11:10 AM

## 2021-05-17 NOTE — Discharge Summary (Signed)
Postpartum Discharge Summary  Date of Service updated 05/17/21     Patient Name: Karen Valencia DOB: 1990-07-08 MRN: [id number]  Date of admission: 05/15/2021 Delivery date:05/16/2021  Delivering provider: Derl Barrow E  Date of discharge: 05/17/2021  Admitting diagnosis: Decreased fetal movement affecting management of pregnancy [O36.8190] Intrauterine pregnancy: [redacted]w[redacted]d     Secondary diagnosis:  Principal Problem:   Decreased fetal movement affecting management of pregnancy  Additional problems: chronic placental abruption, anxiety and depression    Discharge diagnosis: Term Pregnancy Delivered                                              Post partum procedures: none Augmentation: AROM and Pitocin Complications: None  Hospital course: Induction of Labor With Vaginal Delivery   31 y.o. yo [phone] at [redacted]w[redacted]d was admitted to the hospital 05/15/2021 for induction of labor.  Indication for induction:  BPP 4/8 .  Patient had an uncomplicated labor course as follows: Membrane Rupture Time/Date: 12:28 AM ,05/16/2021   Delivery Method:Vaginal, Spontaneous  Episiotomy: None  Lacerations:  None  Details of delivery can be found in separate delivery note.  Patient had a routine postpartum course. Patient is discharged home 05/17/21.  Newborn Data: Birth date:05/16/2021  Birth time:3:18 AM  Gender:Female  Living status:Living  Apgars:6 ,7  Weight:2950 g     Physical exam  Vitals:   05/16/21 1103 05/16/21 1700 05/16/21 2100 05/17/21 0531  BP: 130/64 128/83 127/78 112/76  Pulse: 80 95 93 85  Resp: 17     Temp: 98 F (36.7 C) (!) 97.5 F (36.4 C) 98.1 F (36.7 C)   TempSrc: Oral Oral    SpO2:  97%    Weight:      Height:       General: alert, cooperative, and no distress Lochia: appropriate Uterine Fundus: firm Incision: N/A DVT Evaluation: No evidence of DVT seen on physical exam. Labs: Lab Results  Component Value Date   WBC 9.5 05/17/2021   HGB 11.7 (L)  05/17/2021   HCT 35.0 (L) 05/17/2021   MCV 91.1 05/17/2021   PLT 136 (L) 05/17/2021   CMP Latest Ref Rng & Units 01/18/2021  Glucose 65 - 99 mg/dL 500(X)  BUN 6 - 20 mg/dL 6  Creatinine 3.81 - 8.29 mg/dL 9.37(J)  Sodium 696 - 789 mmol/L 135  Potassium 3.5 - 5.2 mmol/L 4.0  Chloride 96 - 106 mmol/L 102  CO2 20 - 29 mmol/L 17(L)  Calcium 8.7 - 10.2 mg/dL 9.0  Total Protein 6.0 - 8.5 g/dL 6.2  Total Bilirubin 0.0 - 1.2 mg/dL 0.2  Alkaline Phos 44 - 121 IU/L 35(L)  AST 0 - 40 IU/L 12  ALT 0 - 32 IU/L 6   Edinburgh Score: Edinburgh Postnatal Depression Scale Screening Tool 05/17/2021  I have been able to laugh and see the funny side of things. 0  I have looked forward with enjoyment to things. 0  I have blamed myself unnecessarily when things went wrong. 2  I have been anxious or worried for no good reason. 2  I have felt scared or panicky for no good reason. 1  Things have been getting on top of me. 1  I have been so unhappy that I have had difficulty sleeping. 0  I have felt sad or miserable. 1  I have been so unhappy that  I have been crying. 0  The thought of harming myself has occurred to me. 0  Edinburgh Postnatal Depression Scale Total 7      After visit meds:  Allergies as of 05/17/2021   No Known Allergies      Medication List     TAKE these medications    acetaminophen 325 MG tablet Commonly known as: TYLENOL Take 325-650 mg by mouth every 6 (six) hours as needed for mild pain.   calcium carbonate 500 MG chewable tablet Commonly known as: TUMS - dosed in mg elemental calcium Chew 1-2 tablets by mouth as needed for indigestion or heartburn.   prenatal multivitamin Tabs tablet Take 1 tablet by mouth daily at 12 noon.         Discharge home in stable condition Infant Feeding: Breast Infant Disposition:home with mother Discharge instruction: per After Visit Summary and Postpartum booklet. Activity: Advance as tolerated. Pelvic rest for 6 weeks.  Diet:  routine diet Anticipated Birth Control: Unsure Postpartum Appointment:6 weeks Additional Postpartum F/U: Postpartum Depression checkup Future Appointments:No future appointments. Follow up Visit:  Follow-up Information     Edwinna Areola, DO Follow up in 2 week(s).   Specialty: Obstetrics and Gynecology Why: 2-3 weeks for posptartum depression check Contact information: [address] [place] [address] [place] [address] [phone]                     05/17/2021 [facility] GE[facility] Chestine Spore, MD

## 2021-05-17 NOTE — Social Work (Signed)
CSW verbally consulted and informed family in need of a car seat. CSW provided car seat. MOB reported no additional needs prior to discharge.  ° °Ilario Dhaliwal, LCSWA °Clinical Social Work °[facility] °[phone]  °

## 2021-05-18 LAB — SURGICAL PATHOLOGY

## 2021-05-29 ENCOUNTER — Telehealth (HOSPITAL_COMMUNITY): Payer: Self-pay | Admitting: *Deleted

## 2021-05-29 NOTE — Telephone Encounter (Signed)
Hospital Discharge Follow-Up Call:  Patient reports that she is well and has no concerns about her healing process.  EPDS today was 6 and patient endorses this accurately reflects that she is doing well emotionally.  She says that she is currently on Zoloft and provider has asked her to call if patient thinks dosage needs to be increased.  She says that baby is doing well and she has no concerns about baby's health.  Patient reports that baby sleeps in a bassinet in her bedroom.  Reviewed ABCs of Safe Sleep.

## 2022-09-29 IMAGING — US US MFM OB FOLLOW-UP
1 series · 14 of 28 positions shown · non-contrast
Comparison: none

[Series 1: us mfm ob follow-up · 50 acquisitions, 14 frames shown]
[im 2/50]
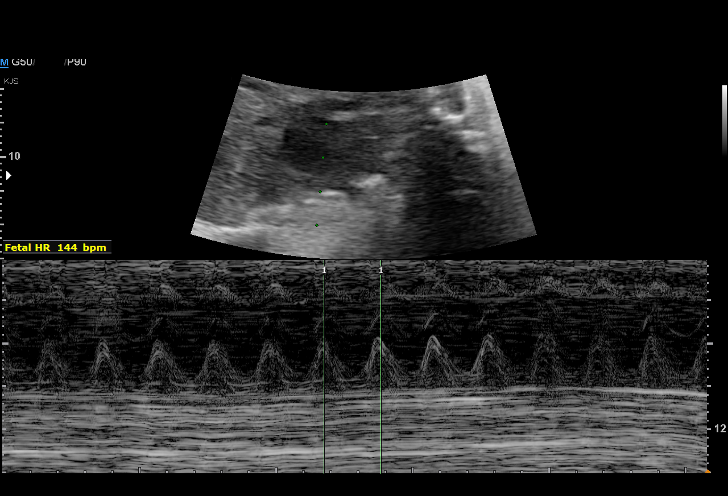
[im 6/50]
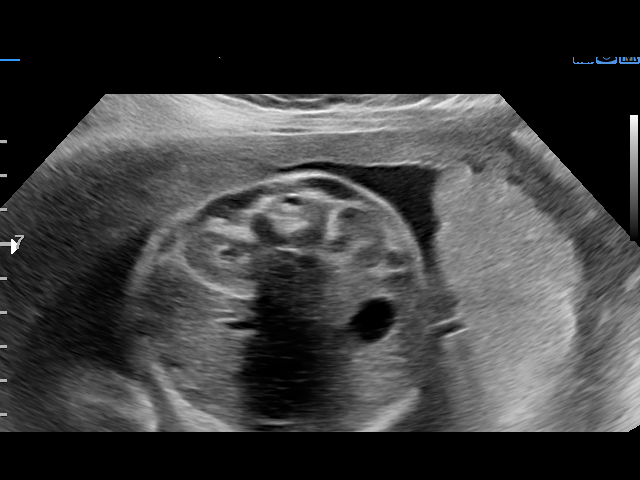
[im 10/50]
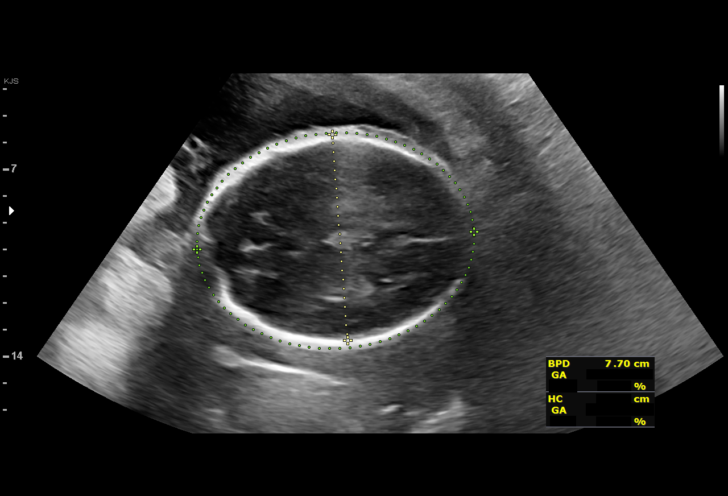
[im 13/50]
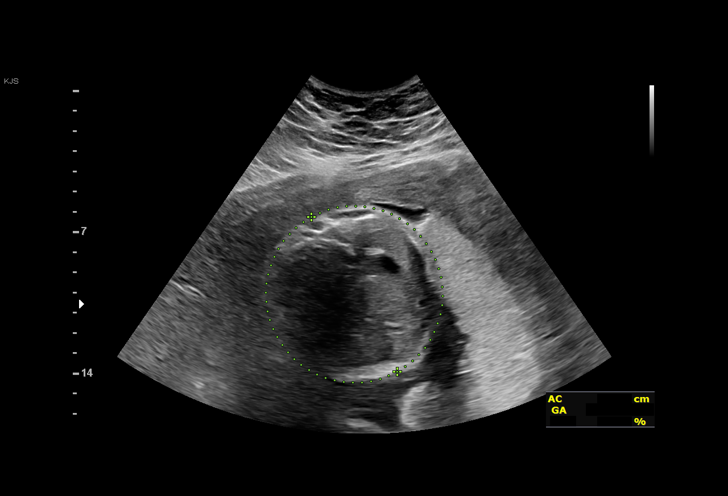
[im 17/50]
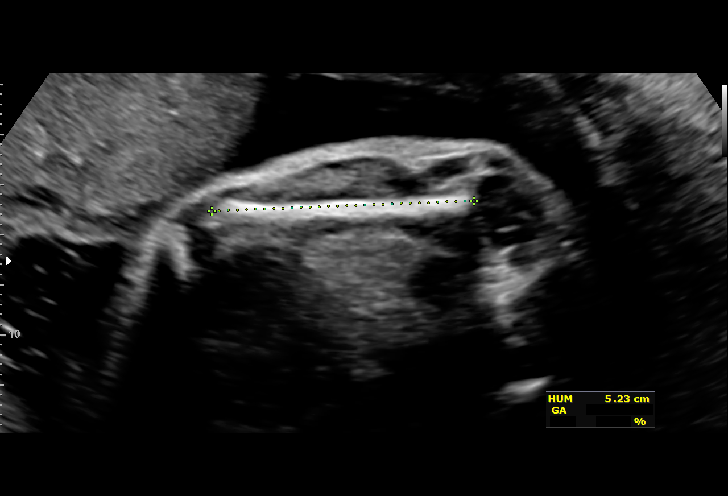
[im 20/50]
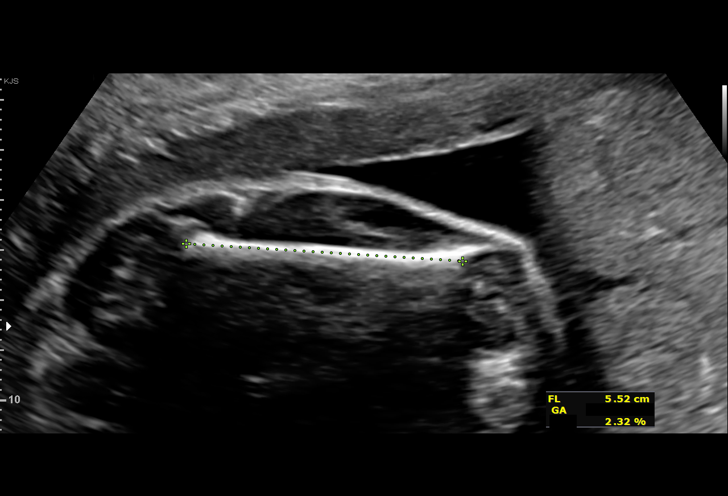
[im 24/50]
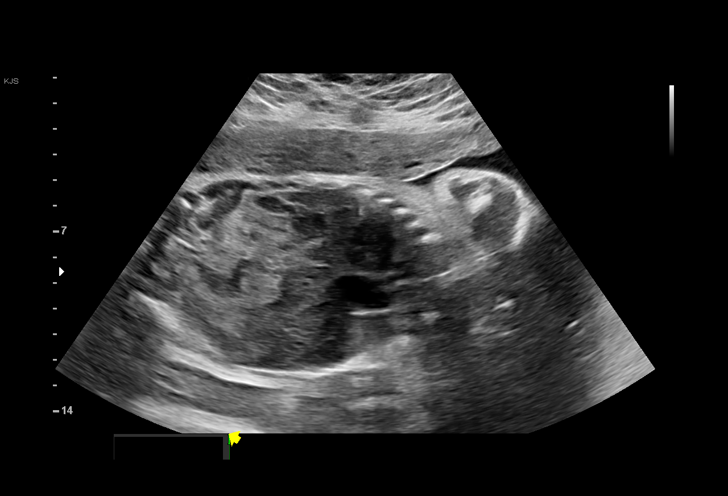
[im 28/50]
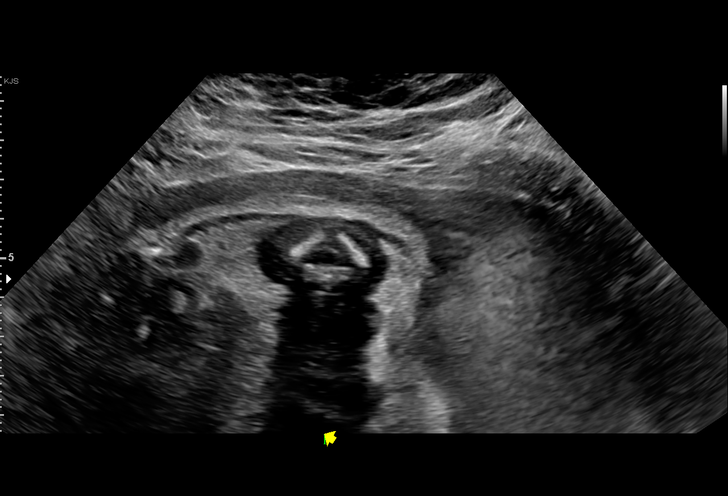
[im 31/50]
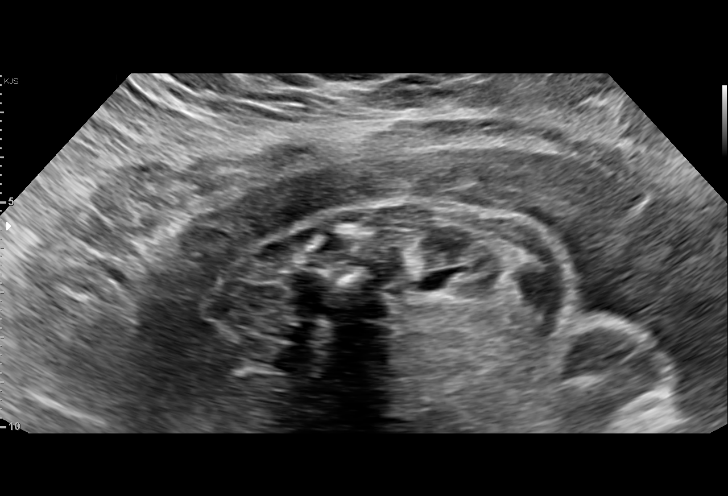
[im 35/50]
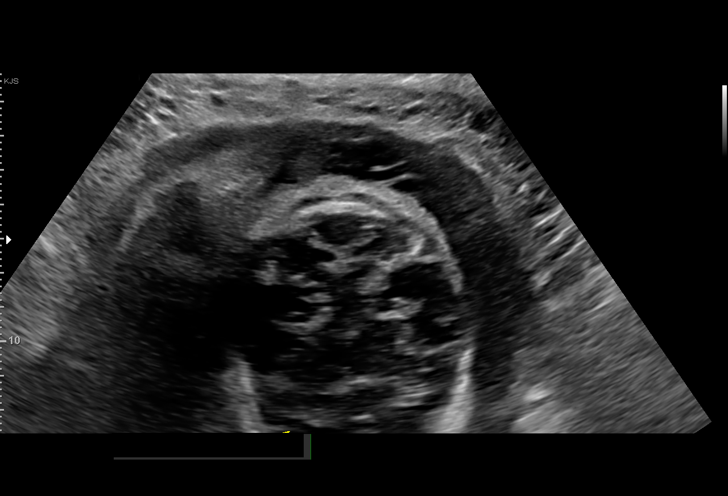
[im 39/50]
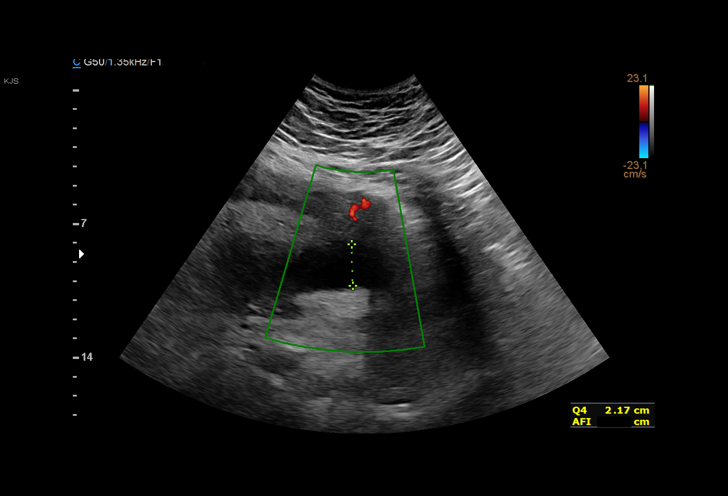
[im 42/50]
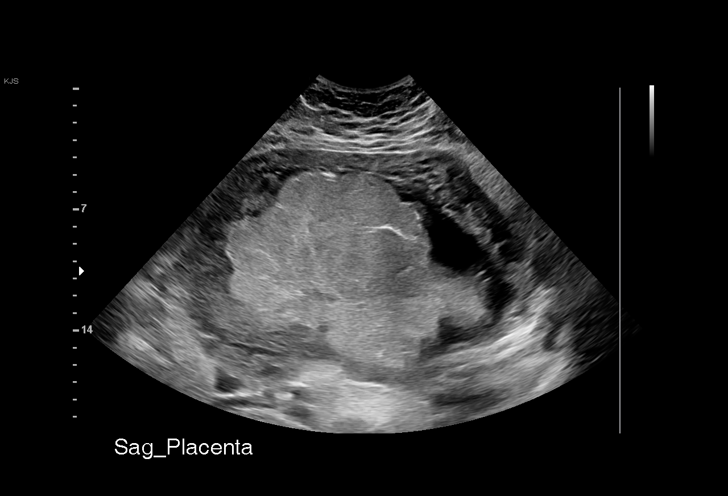
[im 46/50]
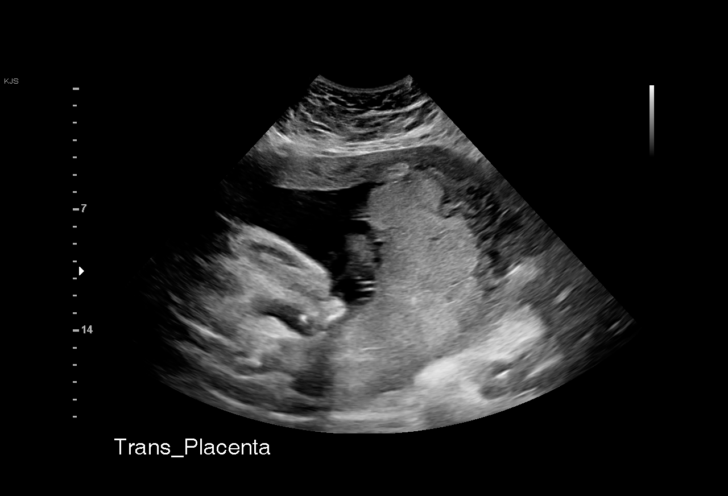
[im 50/50]
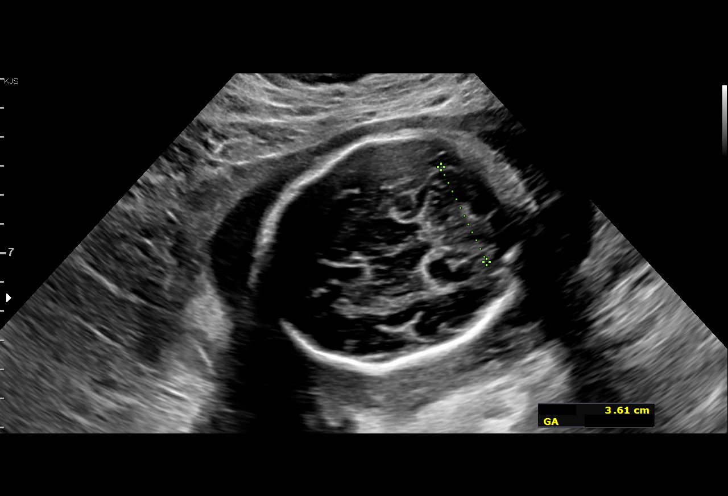

[14 of 28 positions shown; findings below may reference images not displayed]

#101

Indications

 Subchorionic hemorrhage, antepartum
 Obesity complicating pregnancy, second
 trimester (BMI 48)
 Maternal care for known or suspected poor
 fetal growth, third trimester, fetus 1 IUGR
 Encounter for other antenatal screening
 follow-up
 31 weeks gestation of pregnancy
 LR NIPS
Fetal Evaluation

 Num Of Fetuses:         1
 Fetal Heart Rate(bpm):  144
 Cardiac Activity:       Observed
 Presentation:           Cephalic
 Placenta:               Posterior
 P. Cord Insertion:      Visualized, central

 Amniotic Fluid
 AFI FV:      Within normal limits

 AFI Sum(cm)     %Tile       Largest Pocket(cm)
 17.49           65

 RUQ(cm)       RLQ(cm)       LUQ(cm)        LLQ(cm)

Biometry
 BPD:      76.6  mm     G. Age:  30w 5d         23  %    CI:        70.48   %    70 - 86
                                                         FL/HC:      19.1   %    19.3 -
 HC:      290.9  mm     G. Age:  32w 0d         33  %    HC/AC:      1.06        0.96 -
 AC:      273.2  mm     G. Age:  31w 3d         51  %    FL/BPD:     72.7   %    71 - 87
 FL:       55.7  mm     G. Age:  29w 2d        3.3  %    FL/AC:      20.4   %    20 - 24
 HUM:        52  mm     G. Age:  30w 2d         32  %
 LV:        3.6  mm

 Est. FW:    1934  gm    3 lb 10 oz      23  %
Gestational Age

 LMP:           31w 2d        Date:  08/22/20                 EDD:   05/29/21
 U/S Today:     30w 6d                                        EDD:   06/01/21
 Best:          31w 2d     Det. By:  LMP  (08/22/20)          EDD:   05/29/21
Anatomy

 Cranium:               Appears normal         Aortic Arch:            Previously seen
 Cavum:                 Appears normal         Ductal Arch:            Previously seen
 Ventricles:            Appears normal         Diaphragm:              Appears normal
 Choroid Plexus:        Previously seen        Stomach:                Appears normal, left
                                                                       sided
 Cerebellum:            Appears normal         Abdomen:                Previously seen
 Posterior Fossa:       Appears normal         Abdominal Wall:         Previously seen
 Nuchal Fold:           Not applicable (>20    Cord Vessels:           Previously seen
                        wks GA)
 Face:                  Orbits and profile     Kidneys:                Appear normal
                        previously seen
 Lips:                  Previously seen        Bladder:                Appears normal
 Thoracic:              Previously seen        Spine:                  Limited views
                                                                       appear normal
 Heart:                 Previously seen        Upper Extremities:      Previously seen
 RVOT:                  Previously seen        Lower Extremities:      Previously seen
 LVOT:                  Previously seen

 Other:  Female gender previously seen. VC, 3VV and 3VTV prev visualized.
         Technically difficult due to maternal habitus.
Cervix Uterus Adnexa

 Cervix
 Not visualized (advanced GA >67wks)

 Uterus
 No abnormality visualized.
Impression

 Follow up growth due to elevated BMI and to clear the fetal
 anatomy
 Normal interval growth with measurements consistent with
 dates
 Good fetal movement and amniotic fluid volume
 Anatomy cleared today.
Recommendations

 Follow up growth in 4-6 weeks.
 Initiate weekly testing at 34 weeks.
